# Patient Record
Sex: Male | Born: 1965 | Race: White | Hispanic: No | Marital: Single | Smoking: Never smoker
Health system: Southern US, Community
[De-identification: ages and names within clinical notes are randomized; demographics above are authoritative.]

## PROBLEM LIST (undated history)

## (undated) DIAGNOSIS — S24109A Unspecified injury at unspecified level of thoracic spinal cord, initial encounter: Secondary | ICD-10-CM

## (undated) DIAGNOSIS — I2510 Atherosclerotic heart disease of native coronary artery without angina pectoris: Secondary | ICD-10-CM

## (undated) DIAGNOSIS — E785 Hyperlipidemia, unspecified: Secondary | ICD-10-CM

## (undated) DIAGNOSIS — F1011 Alcohol abuse, in remission: Secondary | ICD-10-CM

## (undated) DIAGNOSIS — I10 Essential (primary) hypertension: Secondary | ICD-10-CM

## (undated) DIAGNOSIS — I480 Paroxysmal atrial fibrillation: Secondary | ICD-10-CM

## (undated) DIAGNOSIS — F1410 Cocaine abuse, uncomplicated: Secondary | ICD-10-CM

## (undated) DIAGNOSIS — E119 Type 2 diabetes mellitus without complications: Secondary | ICD-10-CM

## (undated) DIAGNOSIS — R7303 Prediabetes: Secondary | ICD-10-CM

## (undated) DIAGNOSIS — E669 Obesity, unspecified: Secondary | ICD-10-CM

## (undated) HISTORY — DX: Prediabetes: R73.03

## (undated) HISTORY — DX: Hyperlipidemia, unspecified: E78.5

## (undated) HISTORY — DX: Paroxysmal atrial fibrillation: I48.0

## (undated) HISTORY — DX: Cocaine abuse, uncomplicated: F14.10

## (undated) HISTORY — DX: Atherosclerotic heart disease of native coronary artery without angina pectoris: I25.10

## (undated) HISTORY — DX: Alcohol abuse, in remission: F10.11

## (undated) HISTORY — DX: Essential (primary) hypertension: I10

## (undated) HISTORY — DX: Obesity, unspecified: E66.9

---

## 2009-10-21 ENCOUNTER — Inpatient Hospital Stay: Payer: Self-pay | Admitting: Internal Medicine

## 2010-07-08 ENCOUNTER — Emergency Department: Payer: Self-pay | Admitting: Unknown Physician Specialty

## 2011-01-03 ENCOUNTER — Inpatient Hospital Stay: Payer: Self-pay | Admitting: Internal Medicine

## 2011-01-04 DIAGNOSIS — R072 Precordial pain: Secondary | ICD-10-CM

## 2011-01-21 ENCOUNTER — Inpatient Hospital Stay: Payer: Self-pay | Admitting: Internal Medicine

## 2011-01-21 DIAGNOSIS — R079 Chest pain, unspecified: Secondary | ICD-10-CM

## 2011-02-07 ENCOUNTER — Observation Stay: Payer: Self-pay | Admitting: Internal Medicine

## 2011-02-14 ENCOUNTER — Inpatient Hospital Stay: Payer: Self-pay | Admitting: Internal Medicine

## 2011-02-19 ENCOUNTER — Emergency Department: Payer: Self-pay | Admitting: Unknown Physician Specialty

## 2011-02-20 ENCOUNTER — Emergency Department: Payer: Self-pay | Admitting: Emergency Medicine

## 2011-02-28 ENCOUNTER — Emergency Department: Payer: Self-pay | Admitting: Emergency Medicine

## 2013-03-04 ENCOUNTER — Ambulatory Visit: Payer: Self-pay | Admitting: Family Medicine

## 2013-03-04 LAB — BODY FLUID CELL COUNT WITH DIFFERENTIAL
Basophil: 0 %
Eosinophil: 0 %
Neutrophils: 75 %
Other Cells BF: 0 %

## 2013-04-06 IMAGING — CR DG CHEST 1V PORT
1 series · 1 of 1 positions shown · non-contrast
Comparison: none

REASON FOR EXAM: Chest Pain
COMMENTS:

PROCEDURE:     DXR - DXR PORTABLE CHEST SINGLE VIEW  - January 20, 2011  [DATE]
RESULT:     Comparison: 01/03/2011

[view not recorded]
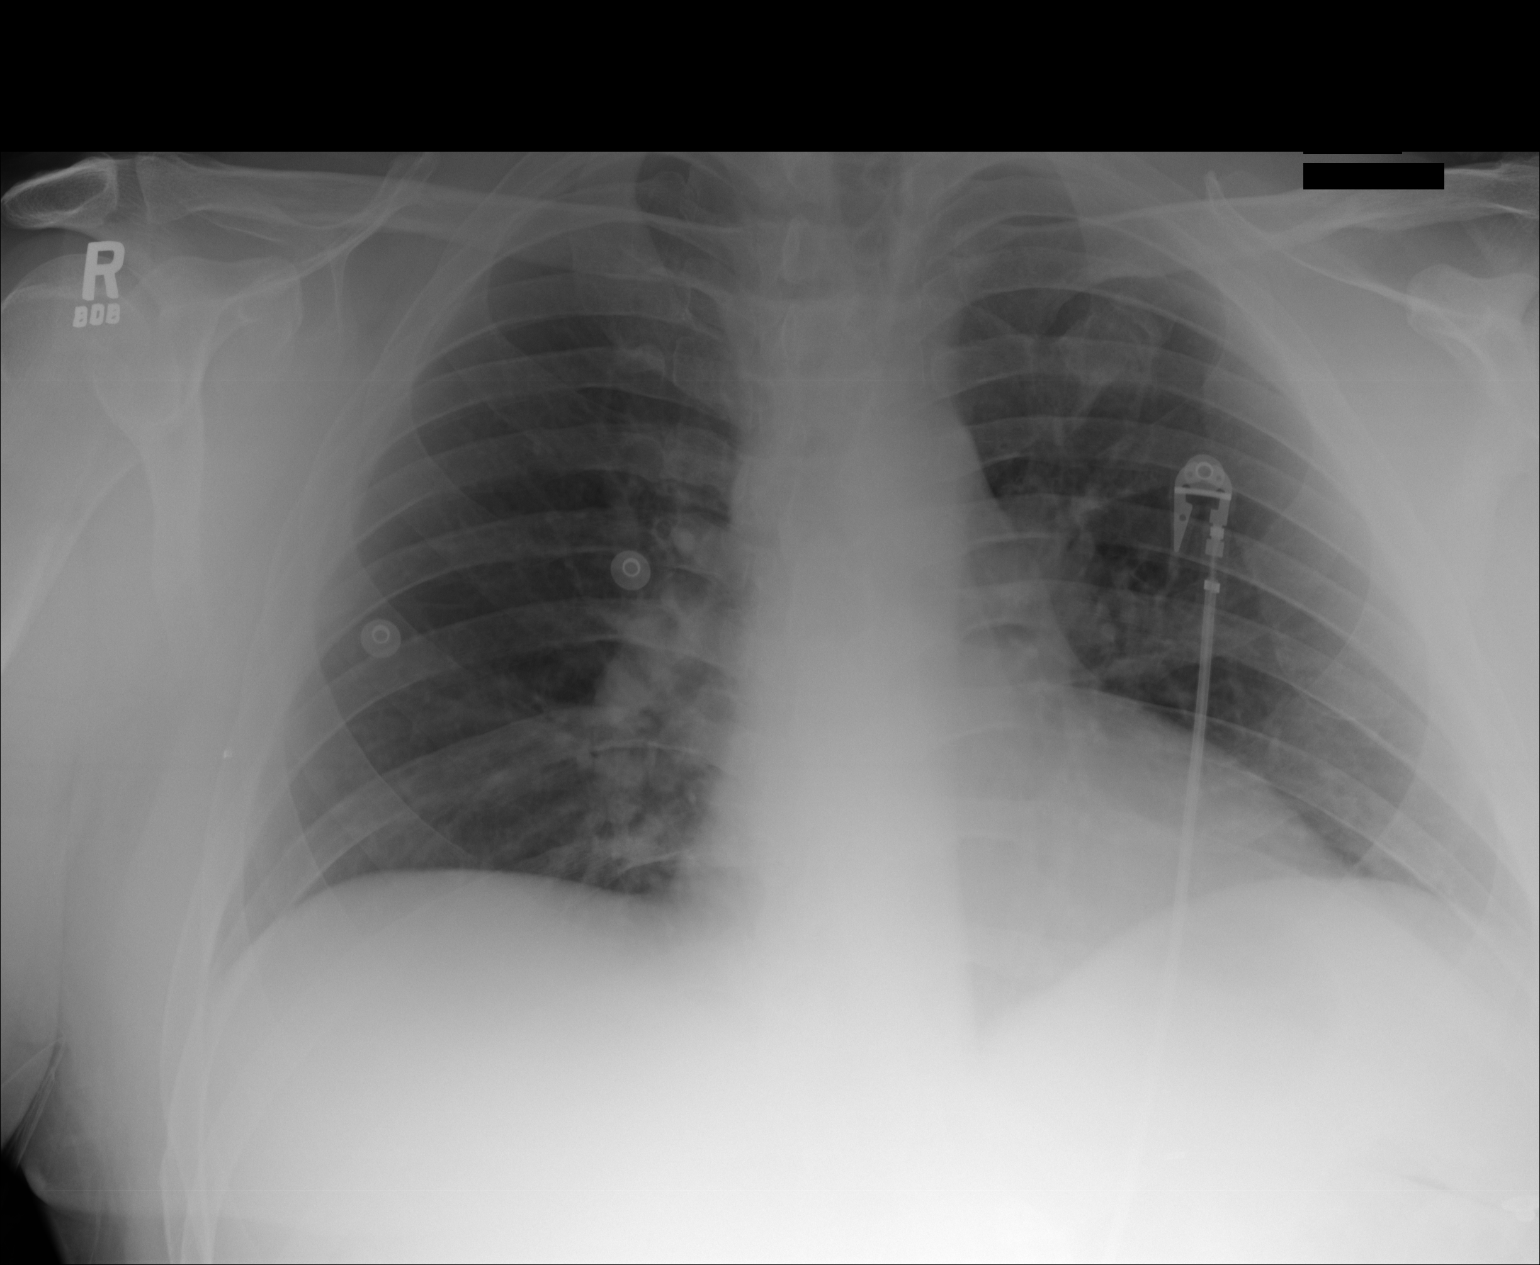

[1 of 1 positions shown; findings below may reference images not displayed]

FINDINGS: The lung volumes are low. Heart and mediastinum are stable. Prominence of
the central pulmonary vasculature is similar to prior. No focal pulmonary
opacities.
IMPRESSION: No acute cardiopulmonary disease.

## 2013-04-06 IMAGING — CT CT CHEST W/ CM
3 series · 17 of 31 positions shown, 19 images · non-contrast
Comparison: none

REASON FOR EXAM: chest pain
COMMENTS:

[Series 6: soft tissue · axial · 0.83mm/px · z∈[-668,-396]mm · 8 of 113 slices shown (1 of 2)]
[im 11/113  mediastinal]
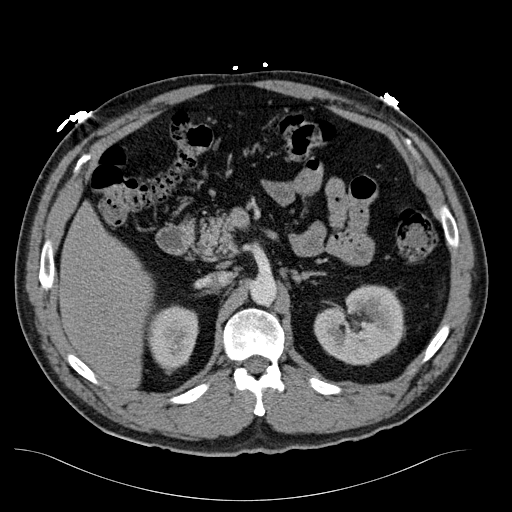
[im 31/113  mediastinal]
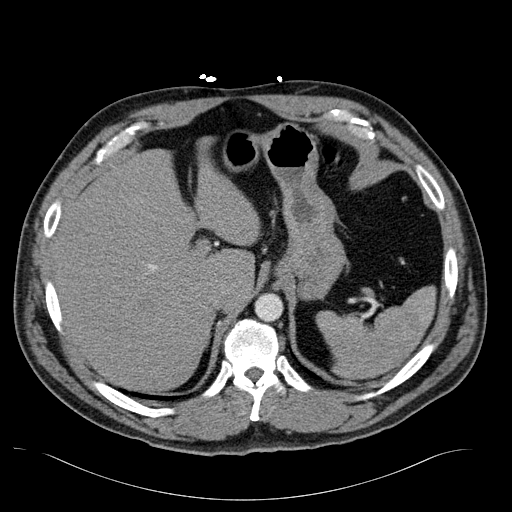
[im 41/113  mediastinal]
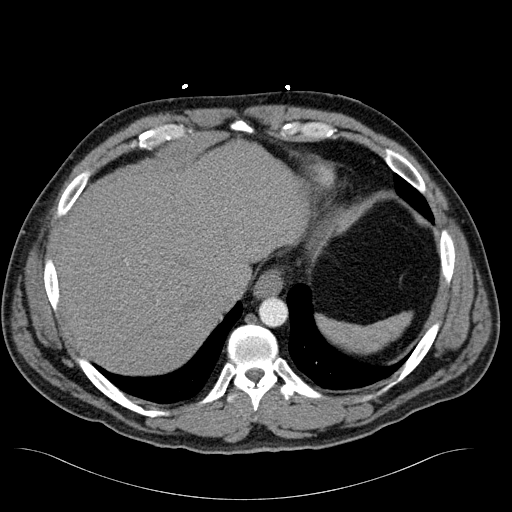
[im 51/113  mediastinal]
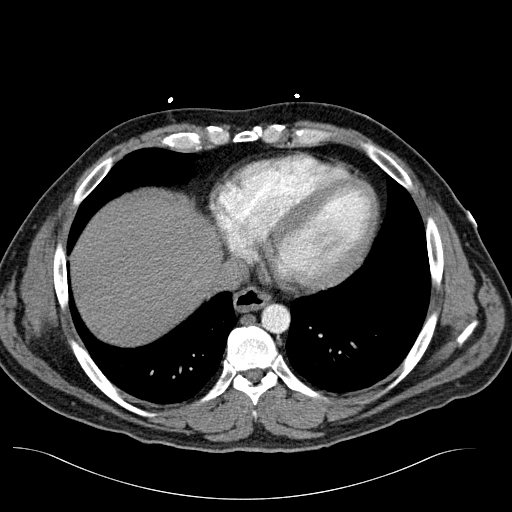
[im 62/113  mediastinal]
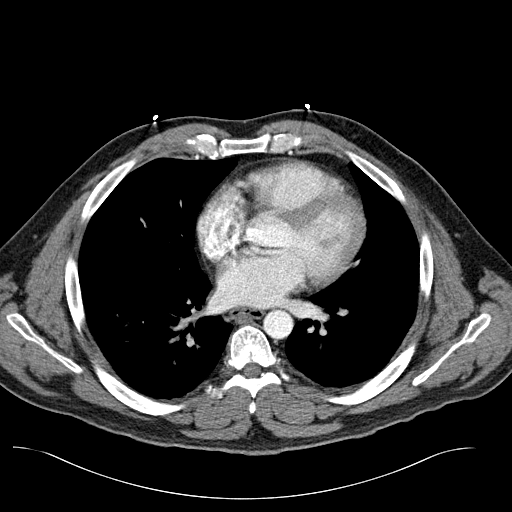
[im 72/113  mediastinal]
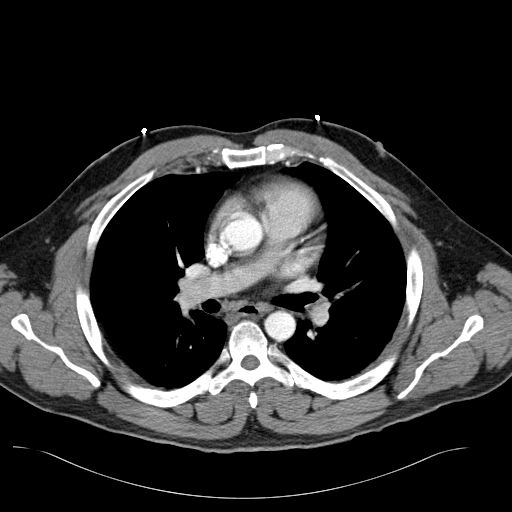
[im 82/113  mediastinal]
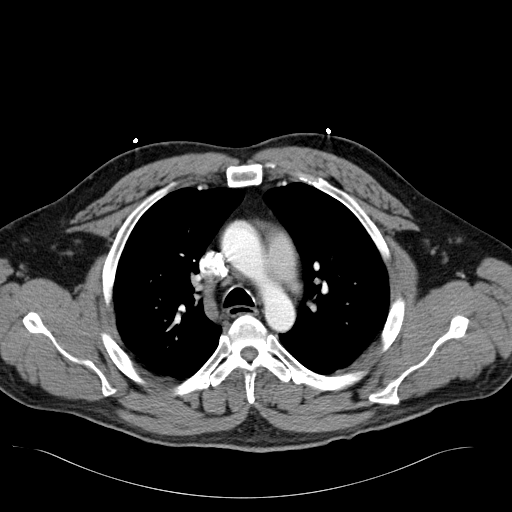
[im 102/113  mediastinal]
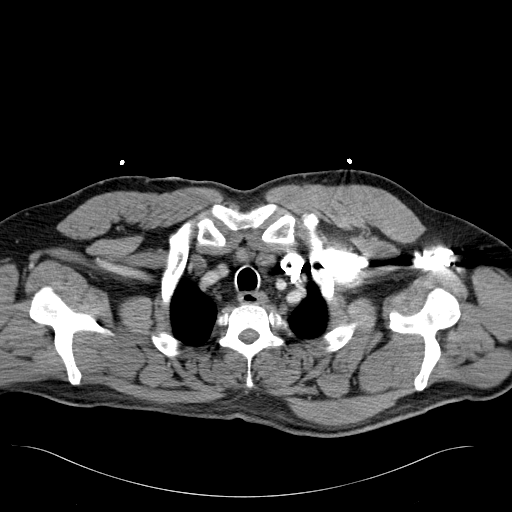

[Series 14: soft tissue · axial · 0.83mm/px · z∈[-620,-590]mm · 2 of 73 slices shown (2 of 2)]
[im 11/73  mediastinal]
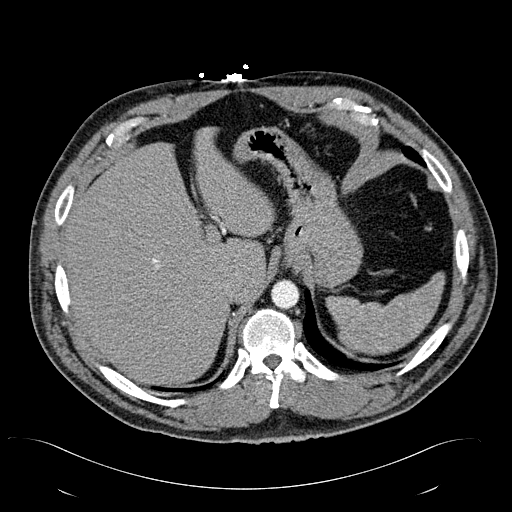
[im 21/73  mediastinal]
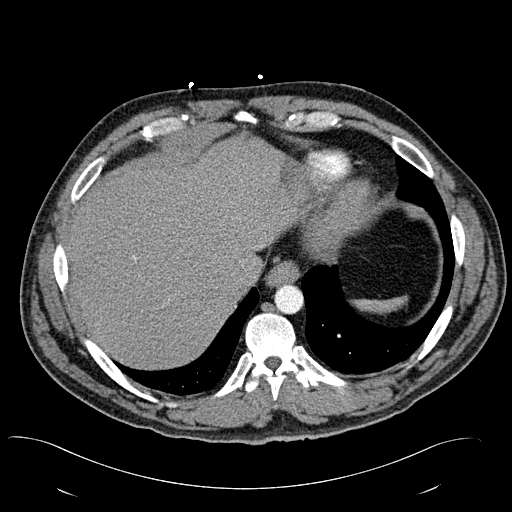

[Series 15: lung windows · axial · 0.83mm/px · z∈[-614,-464]mm · 7 of 71 slices shown, 9 images]
[im 11/71  mediastinal]
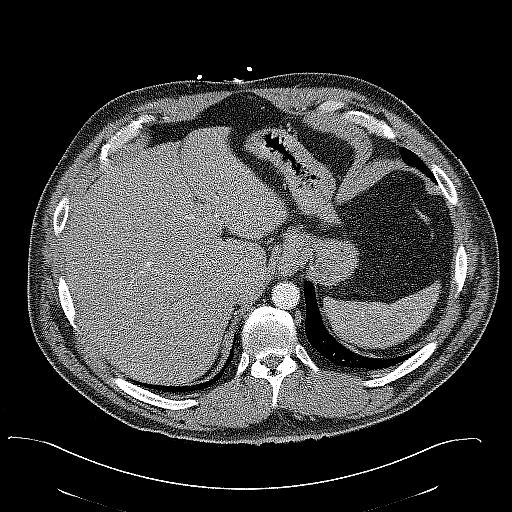
[im 11/71  lung]
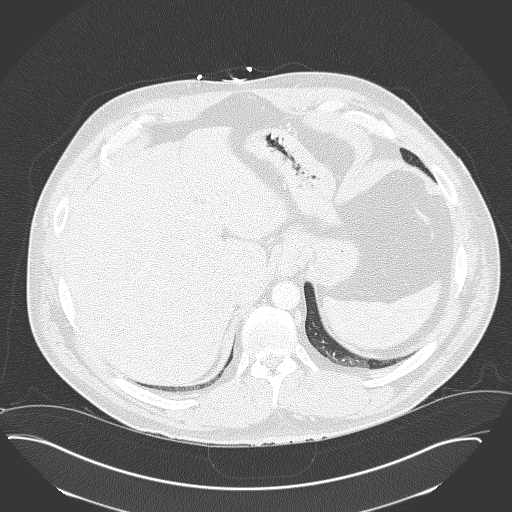
[im 21/71  lung]
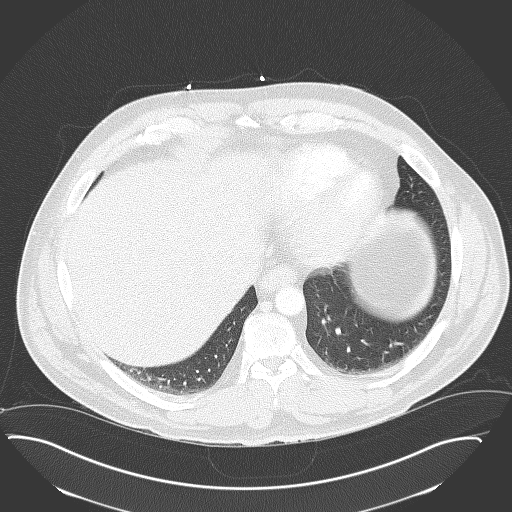
[im 31/71  lung]
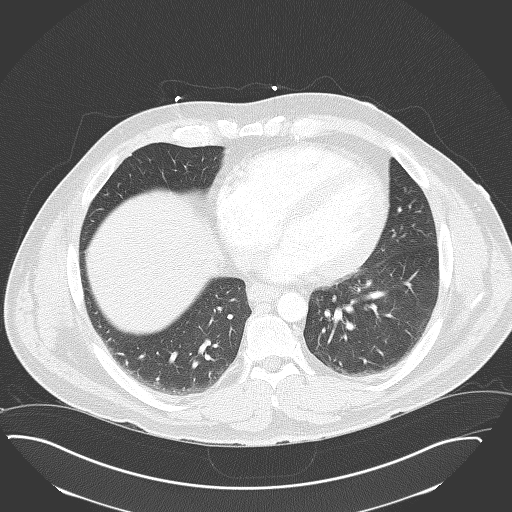
[im 36/71  lung]
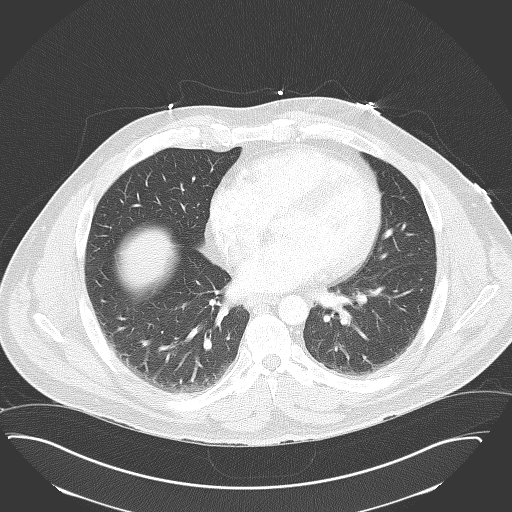
[im 41/71  mediastinal]
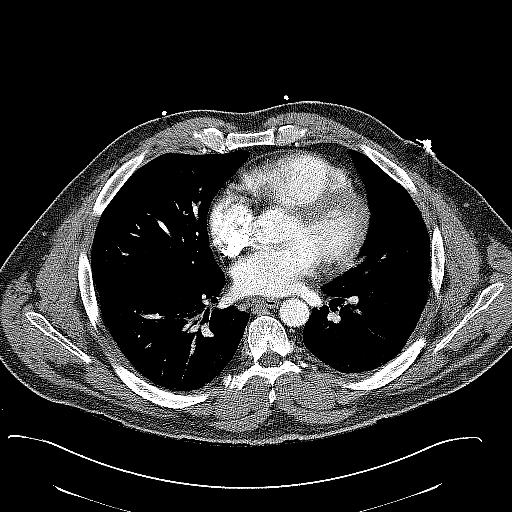
[im 41/71  lung]
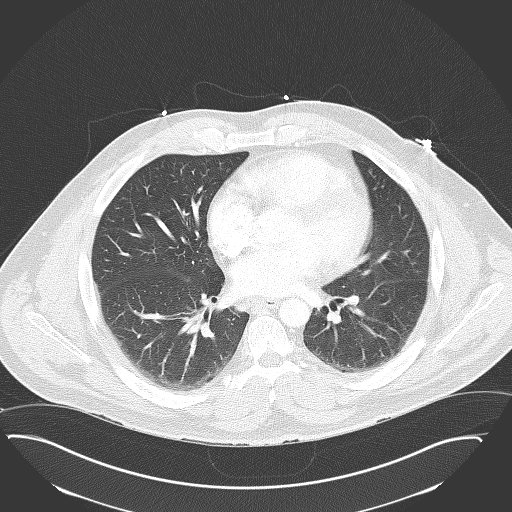
[im 51/71  lung]
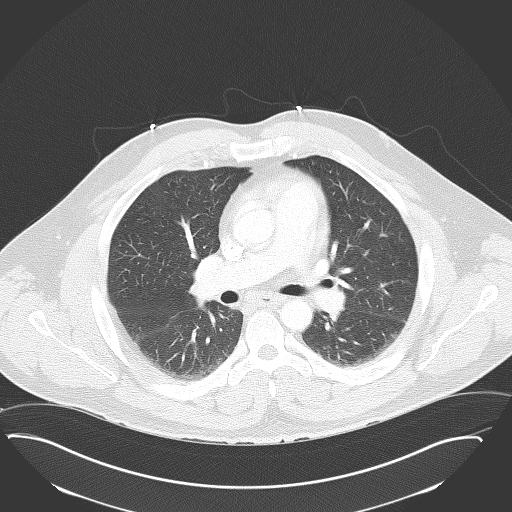
[im 61/71  lung]
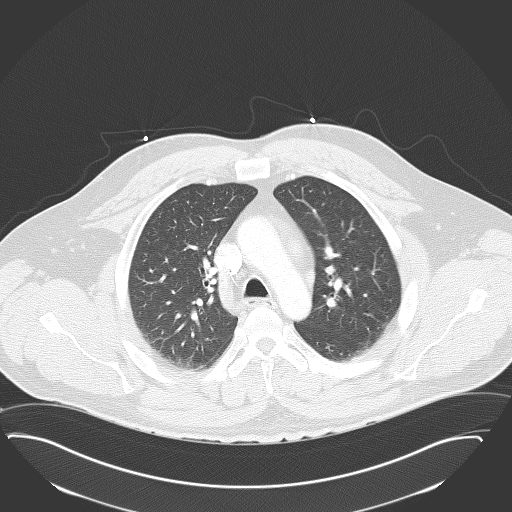

[17 of 31 positions shown; findings below may reference images not displayed]

PROCEDURE:     CT  - CT CHEST (FOR PE) W  - January 20, 2011 [DATE]

RESULT:     CT of the chest is performed with 100 mL of Hsovue-KFC iodinated
intravenous contrast with images reconstructed at 3.0 mm slice thickness in
the axial plane compared to the previous study of 01/04/2011 and to an
earlier exam of 10/22/2009.

The pulmonary arterial bolus is marginal in quality. No large central
embolic filling defects are present. The thoracic aorta appears normal in
caliber without evidence of dissection. No pleural or pericardial effusion
is demonstrated. A small sliding-type hiatal hernia is suggested. Small
accessory spleen is seen along the anterior-inferior aspect of the spleen on
image 87 and 88. The visualized pancreas, liver, gallbladder and kidneys
appear unremarkable. The included portions of the thyroid lobes appear
within normal limits. The heart is normal in size without or cardio
effusion. The lung window images demonstrate normal appearing aeration
without a focal mass or nodule demonstrated.
IMPRESSION: 1. No significant change compared to the recent exam. No thoracic aortic
aneurysm or dissection. The lungs appear clear. No pulmonary embolic filling
defect evident. The opacification of the pulmonary arterial system is
marginal at best.

## 2014-01-05 ENCOUNTER — Other Ambulatory Visit: Payer: Self-pay | Admitting: Nurse Practitioner

## 2014-01-05 ENCOUNTER — Observation Stay: Payer: Self-pay | Admitting: Specialist

## 2014-01-05 ENCOUNTER — Encounter: Payer: Self-pay | Admitting: Nurse Practitioner

## 2014-01-05 DIAGNOSIS — E785 Hyperlipidemia, unspecified: Secondary | ICD-10-CM

## 2014-01-05 DIAGNOSIS — I10 Essential (primary) hypertension: Secondary | ICD-10-CM

## 2014-01-05 DIAGNOSIS — I480 Paroxysmal atrial fibrillation: Secondary | ICD-10-CM

## 2014-01-05 DIAGNOSIS — R079 Chest pain, unspecified: Secondary | ICD-10-CM

## 2014-01-05 LAB — COMPREHENSIVE METABOLIC PANEL
AST: 18 U/L (ref 15–37)
Albumin: 3.5 g/dL (ref 3.4–5.0)
Alkaline Phosphatase: 57 U/L
Anion Gap: 14 (ref 7–16)
BUN: 6 mg/dL — ABNORMAL LOW (ref 7–18)
Bilirubin,Total: 0.7 mg/dL (ref 0.2–1.0)
CALCIUM: 8.7 mg/dL (ref 8.5–10.1)
CHLORIDE: 103 mmol/L (ref 98–107)
Co2: 19 mmol/L — ABNORMAL LOW (ref 21–32)
Creatinine: 0.97 mg/dL (ref 0.60–1.30)
EGFR (Non-African Amer.): >60
GLUCOSE: 201 mg/dL — AB (ref 65–99)
OSMOLALITY: 275 (ref 275–301)
Potassium: 3.5 mmol/L (ref 3.5–5.1)
SGPT (ALT): 25 U/L
Sodium: 136 mmol/L (ref 136–145)
TOTAL PROTEIN: 7.6 g/dL (ref 6.4–8.2)

## 2014-01-05 LAB — CBC WITH DIFFERENTIAL/PLATELET
BASOS ABS: 0.1 10*3/uL (ref 0.0–0.1)
Basophil %: 0.7 %
EOS ABS: 0.1 10*3/uL (ref 0.0–0.7)
Eosinophil %: 0.8 %
HCT: 45.1 % (ref 40.0–52.0)
HGB: 14.4 g/dL (ref 13.0–18.0)
LYMPHS ABS: 1.5 10*3/uL (ref 1.0–3.6)
Lymphocyte %: 16 %
MCH: 30 pg (ref 26.0–34.0)
MCHC: 32 g/dL (ref 32.0–36.0)
MCV: 94 fL (ref 80–100)
Monocyte #: 0.9 x10 3/mm (ref 0.2–1.0)
Monocyte %: 9.7 %
NEUTROS PCT: 72.8 %
Neutrophil #: 6.8 10*3/uL — ABNORMAL HIGH (ref 1.4–6.5)
PLATELETS: 257 10*3/uL (ref 150–440)
RBC: 4.81 10*6/uL (ref 4.40–5.90)
RDW: 13.5 % (ref 11.5–14.5)
WBC: 9.3 10*3/uL (ref 3.8–10.6)

## 2014-01-05 LAB — DRUG SCREEN, URINE
AMPHETAMINES, UR SCREEN: NEGATIVE (ref ?–1000)
Amphetamines, Ur Screen: NEGATIVE (ref ?–1000)
BARBITURATES, UR SCREEN: NEGATIVE (ref ?–200)
BARBITURATES, UR SCREEN: NEGATIVE (ref ?–200)
BENZODIAZEPINE, UR SCRN: NEGATIVE (ref ?–200)
Benzodiazepine, Ur Scrn: NEGATIVE (ref ?–200)
COCAINE METABOLITE, UR ~~LOC~~: POSITIVE (ref ?–300)
Cannabinoid 50 Ng, Ur ~~LOC~~: NEGATIVE (ref ?–50)
Cannabinoid 50 Ng, Ur ~~LOC~~: NEGATIVE (ref ?–50)
Cocaine Metabolite,Ur ~~LOC~~: POSITIVE (ref ?–300)
MDMA (ECSTASY) UR SCREEN: NEGATIVE (ref ?–500)
MDMA (Ecstasy)Ur Screen: NEGATIVE (ref ?–500)
Methadone, Ur Screen: NEGATIVE (ref ?–300)
Methadone, Ur Screen: NEGATIVE (ref ?–300)
OPIATE, UR SCREEN: POSITIVE (ref ?–300)
OPIATE, UR SCREEN: NEGATIVE (ref ?–300)
Phencyclidine (PCP) Ur S: NEGATIVE (ref ?–25)
Phencyclidine (PCP) Ur S: NEGATIVE (ref ?–25)
TRICYCLIC, UR SCREEN: NEGATIVE (ref ?–1000)
Tricyclic, Ur Screen: NEGATIVE (ref ?–1000)

## 2014-01-05 LAB — LIPID PANEL
CHOLESTEROL: 213 mg/dL — AB (ref 0–200)
HDL Cholesterol: 34 mg/dL — ABNORMAL LOW (ref 40–60)
LDL CHOLESTEROL, CALC: 156 mg/dL — AB (ref 0–100)
Triglycerides: 114 mg/dL (ref 0–200)
VLDL CHOLESTEROL, CALC: 23 mg/dL (ref 5–40)

## 2014-01-05 LAB — URINALYSIS, COMPLETE
BLOOD: NEGATIVE
Bacteria: NONE SEEN
Bilirubin,UR: NEGATIVE
Glucose,UR: >=500 mg/dL (ref 0–75)
LEUKOCYTE ESTERASE: NEGATIVE
Nitrite: NEGATIVE
PH: 5 (ref 4.5–8.0)
Protein: 30
RBC,UR: 8 /HPF (ref 0–5)
SPECIFIC GRAVITY: 1.028 (ref 1.003–1.030)
WBC UR: 8 /HPF (ref 0–5)

## 2014-01-05 LAB — PROTIME-INR
INR: 1.1
Prothrombin Time: 14 secs (ref 11.5–14.7)

## 2014-01-05 LAB — CK TOTAL AND CKMB (NOT AT ARMC)
CK, Total: 74 U/L
CK-MB: 0.6 ng/mL (ref 0.5–3.6)

## 2014-01-05 LAB — PLATELET COUNT: Platelet: 231 10*3/uL (ref 150–440)

## 2014-01-05 LAB — TROPONIN I
Troponin-I: <0.02 ng/mL
Troponin-I: <0.02 ng/mL
Troponin-I: <0.02 ng/mL

## 2014-01-06 LAB — LIPID PANEL
Cholesterol: 171 mg/dL (ref 0–200)
HDL Cholesterol: 30 mg/dL — ABNORMAL LOW (ref 40–60)
Ldl Cholesterol, Calc: 115 mg/dL — ABNORMAL HIGH (ref 0–100)
Triglycerides: 130 mg/dL (ref 0–200)
VLDL Cholesterol, Calc: 26 mg/dL (ref 5–40)

## 2014-01-06 LAB — HEMOGLOBIN A1C: Hemoglobin A1C: 11.2 % — ABNORMAL HIGH (ref 4.2–6.3)

## 2014-01-06 LAB — TSH: Thyroid Stimulating Horm: 0.711 u[IU]/mL

## 2014-01-07 LAB — SEDIMENTATION RATE: Erythrocyte Sed Rate: 55 mm/hr — ABNORMAL HIGH (ref 0–15)

## 2014-01-07 LAB — URIC ACID: URIC ACID: 4.3 mg/dL (ref 3.5–7.2)

## 2014-01-08 LAB — SYNOVIAL CELL COUNT + DIFF, W/ CRYSTALS
Basophil: 0 %
EOS PCT: 0 %
Lymphocytes: 3 %
Neutrophils: 87 %
Nucleated Cell Count: 2483 /mm3
Other Cells BF: 0 %
Other Mononuclear Cells: 10 %

## 2014-01-08 LAB — PLATELET COUNT: Platelet: 304 10*3/uL (ref 150–440)

## 2014-01-09 LAB — PLATELET COUNT: Platelet: 323 10*3/uL (ref 150–440)

## 2014-03-07 LAB — COMPREHENSIVE METABOLIC PANEL
Albumin: 3.6 g/dL (ref 3.4–5.0)
Alkaline Phosphatase: 63 U/L
Anion Gap: 15 (ref 7–16)
BILIRUBIN TOTAL: 0.3 mg/dL (ref 0.2–1.0)
BUN: 4 mg/dL — AB (ref 7–18)
CALCIUM: 8.4 mg/dL — AB (ref 8.5–10.1)
CHLORIDE: 106 mmol/L (ref 98–107)
CO2: 22 mmol/L (ref 21–32)
CREATININE: 0.97 mg/dL (ref 0.60–1.30)
EGFR (African American): >60
EGFR (Non-African Amer.): >60
Glucose: 143 mg/dL — ABNORMAL HIGH (ref 65–99)
OSMOLALITY: 284 (ref 275–301)
POTASSIUM: 3.3 mmol/L — AB (ref 3.5–5.1)
SGOT(AST): 37 U/L (ref 15–37)
SGPT (ALT): 53 U/L
SODIUM: 143 mmol/L (ref 136–145)
TOTAL PROTEIN: 7.6 g/dL (ref 6.4–8.2)

## 2014-03-07 LAB — CBC
HCT: 50.4 % (ref 40.0–52.0)
HGB: 16.7 g/dL (ref 13.0–18.0)
MCH: 30.1 pg (ref 26.0–34.0)
MCHC: 33 g/dL (ref 32.0–36.0)
MCV: 91 fL (ref 80–100)
PLATELETS: 276 10*3/uL (ref 150–440)
RBC: 5.54 10*6/uL (ref 4.40–5.90)
RDW: 16.2 % — AB (ref 11.5–14.5)
WBC: 4.3 10*3/uL (ref 3.8–10.6)

## 2014-03-07 LAB — TROPONIN I

## 2014-03-07 LAB — ETHANOL: ETHANOL LVL: 145 mg/dL

## 2014-03-08 ENCOUNTER — Observation Stay: Payer: Self-pay | Admitting: Specialist

## 2014-03-08 LAB — CK-MB
CK-MB: 1.1 ng/mL (ref 0.5–3.6)
CK-MB: 1.2 ng/mL (ref 0.5–3.6)
CK-MB: 0.9 ng/mL (ref 0.5–3.6)

## 2014-03-08 LAB — TROPONIN I
Troponin-I: <0.02 ng/mL
Troponin-I: <0.02 ng/mL

## 2014-03-23 ENCOUNTER — Inpatient Hospital Stay: Payer: Self-pay | Admitting: Psychiatry

## 2014-03-23 LAB — URINALYSIS, COMPLETE
BACTERIA: NONE SEEN
Bilirubin,UR: NEGATIVE
Blood: NEGATIVE
Glucose,UR: 150 mg/dL (ref 0–75)
LEUKOCYTE ESTERASE: NEGATIVE
Nitrite: NEGATIVE
PH: 6 (ref 4.5–8.0)
Protein: 100
Specific Gravity: 1.028 (ref 1.003–1.030)
Squamous Epithelial: 4

## 2014-03-23 LAB — SALICYLATE LEVEL

## 2014-03-23 LAB — CBC
HCT: 49 % (ref 40.0–52.0)
HGB: 16.2 g/dL (ref 13.0–18.0)
MCH: 30.5 pg (ref 26.0–34.0)
MCHC: 33.1 g/dL (ref 32.0–36.0)
MCV: 92 fL (ref 80–100)
Platelet: 250 10*3/uL (ref 150–440)
RBC: 5.32 10*6/uL (ref 4.40–5.90)
RDW: 16.1 % — AB (ref 11.5–14.5)
WBC: 4.1 10*3/uL (ref 3.8–10.6)

## 2014-03-23 LAB — COMPREHENSIVE METABOLIC PANEL
ALK PHOS: 62 U/L
ALT: 49 U/L
ANION GAP: 12 (ref 7–16)
AST: 31 U/L (ref 15–37)
Albumin: 3.6 g/dL (ref 3.4–5.0)
BUN: 10 mg/dL (ref 7–18)
Bilirubin,Total: 0.2 mg/dL (ref 0.2–1.0)
CO2: 21 mmol/L (ref 21–32)
CREATININE: 1.09 mg/dL (ref 0.60–1.30)
Calcium, Total: 8.2 mg/dL — ABNORMAL LOW (ref 8.5–10.1)
Chloride: 107 mmol/L (ref 98–107)
EGFR (Non-African Amer.): >60
Glucose: 209 mg/dL — ABNORMAL HIGH (ref 65–99)
OSMOLALITY: 285 (ref 275–301)
Potassium: 4.1 mmol/L (ref 3.5–5.1)
Sodium: 140 mmol/L (ref 136–145)
TOTAL PROTEIN: 7.6 g/dL (ref 6.4–8.2)

## 2014-03-23 LAB — DRUG SCREEN, URINE
Amphetamines, Ur Screen: NEGATIVE (ref ?–1000)
Barbiturates, Ur Screen: NEGATIVE (ref ?–200)
Benzodiazepine, Ur Scrn: NEGATIVE (ref ?–200)
Cannabinoid 50 Ng, Ur ~~LOC~~: NEGATIVE (ref ?–50)
Cocaine Metabolite,Ur ~~LOC~~: POSITIVE (ref ?–300)
MDMA (Ecstasy)Ur Screen: NEGATIVE (ref ?–500)
Methadone, Ur Screen: NEGATIVE (ref ?–300)
Opiate, Ur Screen: POSITIVE (ref ?–300)
Phencyclidine (PCP) Ur S: NEGATIVE (ref ?–25)
TRICYCLIC, UR SCREEN: NEGATIVE (ref ?–1000)

## 2014-03-23 LAB — ACETAMINOPHEN LEVEL: Acetaminophen: <2 ug/mL

## 2014-03-23 LAB — ETHANOL: Ethanol: 75 mg/dL

## 2014-06-28 LAB — BASIC METABOLIC PANEL
Anion Gap: 13 (ref 7–16)
BUN: 8 mg/dL
CALCIUM: 8.9 mg/dL
CO2: 25 mmol/L
Chloride: 101 mmol/L
Creatinine: 0.94 mg/dL
EGFR (African American): >60
Glucose: 171 mg/dL — ABNORMAL HIGH
POTASSIUM: 3.3 mmol/L — AB
Sodium: 139 mmol/L

## 2014-06-28 LAB — CBC
HCT: 42.4 % (ref 40.0–52.0)
HGB: 14 g/dL (ref 13.0–18.0)
MCH: 30.3 pg (ref 26.0–34.0)
MCHC: 33.1 g/dL (ref 32.0–36.0)
MCV: 92 fL (ref 80–100)
Platelet: 209 10*3/uL (ref 150–440)
RBC: 4.62 10*6/uL (ref 4.40–5.90)
RDW: 16.3 % — ABNORMAL HIGH (ref 11.5–14.5)
WBC: 8.8 10*3/uL (ref 3.8–10.6)

## 2014-06-28 LAB — TROPONIN I
Troponin-I: <0.03 ng/mL
Troponin-I: <0.03 ng/mL

## 2014-06-28 LAB — D-DIMER(ARMC): D-Dimer: 235 ng/ml

## 2014-06-28 LAB — PRO B NATRIURETIC PEPTIDE: B-Type Natriuretic Peptide: 21 pg/mL

## 2014-06-29 ENCOUNTER — Observation Stay
Admit: 2014-06-29 | Disposition: A | Discharge status: Home / Self Care | Payer: Self-pay | Attending: Internal Medicine | Admitting: Internal Medicine

## 2014-06-29 LAB — TROPONIN I

## 2014-06-29 LAB — DRUG SCREEN, URINE
Amphetamines, Ur Screen: POSITIVE
Barbiturates, Ur Screen: NEGATIVE
Benzodiazepine, Ur Scrn: POSITIVE
CANNABINOID 50 NG, UR ~~LOC~~: NEGATIVE
Cocaine Metabolite,Ur ~~LOC~~: NEGATIVE
MDMA (Ecstasy)Ur Screen: NEGATIVE
METHADONE, UR SCREEN: NEGATIVE
Opiate, Ur Screen: POSITIVE
PHENCYCLIDINE (PCP) UR S: NEGATIVE
Tricyclic, Ur Screen: NEGATIVE

## 2014-07-11 ENCOUNTER — Institutional Professional Consult (permissible substitution): Payer: Self-pay | Admitting: Internal Medicine

## 2014-07-12 ENCOUNTER — Encounter: Payer: Self-pay | Admitting: *Deleted

## 2014-07-21 NOTE — Discharge Summary (Signed)
PATIENT NAMCain Saupe:  [name], [name] MR#:  [id number] DATE OF BIRTH:  [birth year]  DATE OF ADMISSION:  03/08/2014 DATE OF DISCHARGE:  03/08/2014  For a detailed note, please check the history and physical done on admission by Dr. Angelica Ranavid Hower.   DIAGNOSES AT DISCHARGE: As follows: 1.  Chest pain, likely cocaine-induced demand ischemia and also musculoskeletal in nature.  2.  Hypertension.  3.  Hyperlipidemia.  4.  Diabetes.  5.  History of gout.  6.  Substance abuse.   DIET: The patient is being discharged on a low-sodium, carbohydrate-controlled diet.   ACTIVITY: As tolerated.  FOLLOWUP: Follow up at the [facility] in the next 1 to 2 weeks.   DISCHARGE MEDICATIONS: As follows: Lisinopril 5 mg daily, glipizide 5 mg b.i.d., gabapentin 100 mg t.i.d., metformin 1000 mg b.i.d., aspirin 81 mg daily, Cardizem CD 120 mg daily, lovastatin 20 mg daily, allopurinol 100 mg daily, colchicine 0.6 mg 1 capsule b.i.d., Percocet 5/325, 1 tablet q. 6 hours as needed for pain.   PERTINENT STUDIES DONE DURING THE HOSPITAL COURSE: CT scan of the head done without contrast on admission showing no acute intracranial abnormalities. A chest x-ray done on admission showing shallow inspiration without evidence of acute cardiopulmonary disease.   HOSPITAL COURSE: This is a 49 year old male who presented to the hospital with chest pain.  1.  Chest pain. The most likely cause of the patient's chest pain was cocaine-induced demand ischemia also complicated with underlying musculoskeletal in nature. The patient was recently hospitalized last month for similar symptoms, underwent observation and had a myocardial scan done which showed no evidence of cardiac ischemia. The patient was observed on telemetry while in the hospital, had 3 sets of cardiac markers checked, which were negative. He still continues to have chest pain which I think is more musculoskeletal in nature. Since his cardiac markers are negative, he is being  discharged home and strongly advised to quit abusing cocaine.  2.  Diabetes. The patient's blood sugars remained stable. He will continue his glipizide, metformin.  3.  Hypertension. The patient remained hemodynamically stable. He will continue his lisinopril, Cardizem.  4.  History of gout. There was no acute gout attack. He will continue colchicine, allopurinol.  5.  Hyperlipidemia. The patient was maintained on his lovastatin. He will resume that.   CODE STATUS: The patient is a full code.   TIME SPENT: 35 minutes.    ____________________________ Rolly PancakeVivek J. Cherlynn KaiserSainani, MD vjs:ST D: 03/08/2014 15:48:08 ET T: 03/09/2014 02:49:54 ET JOB#: [id number]  cc: Rolly PancakeVivek J. Cherlynn KaiserSainani, MD, <Dictator> Houston SirenVIVEK J Trenee Igoe MD ELECTRONICALLY SIGNED 03/16/2014 10:45

## 2014-07-21 NOTE — H&P (Signed)
PATIENT NAMETERRIUS, [name] MR#:  [id number] DATE OF BIRTH:  1965/09/14  DATE OF ADMISSION:  03/08/2014  REFERRING PHYSICIAN: Dr. Manson Passey   PRIMARY CARE PHYSICIAN: None.   CARDIOLOGIST: None.  CHIEF COMPLAINT: Chest pain.   HISTORY OF PRESENT ILLNESS: This is a 49 year old Caucasian male with a history of coronary artery disease; essential hypertension; type 2 diabetes, non-insulin-requiring, uncomplicated presenting with chest pain. Describes retrosternal chest pain which occurred at rest and after cocaine usage, radiation to the left shoulder, head and neck area, sharp in quality which changed to pressure, intensity 10/10. No relieving or worsening factors. Associated with nausea with shortness of breath and, once again, admits to cocaine abuse.   REVIEW OF SYSTEMS:  CONSTITUTIONAL: Denies fevers, chills, fatigue, weakness.  EYES: Denies blurred vision, double vision, or eye pain.  EARS, NOSE, AND THROAT: Denies tinnitus, ear pain, hearing loss.  RESPIRATORY: Denies cough or shortness of shortness of breath. Denies wheezing.  CARDIOVASCULAR: Positive for chest pain as described above. Denies any palpitations or edema.  GASTROINTESTINAL: Denies nausea, vomiting, diarrhea, or abdominal pain.  GENITOURINARY: Denies dysuria or hematuria.  ENDOCRINE: Denies nocturia or thyroid problems. HEMATOLOGIC AND LYMPHATIC: Denies easy bruising or bleeding.  SKIN: Denies  rashes or lesions.  MUSCULOSKELETAL: Denies pain in neck, back, shoulder, knees, hips, or arthritic symptoms.  NEUROLOGIC: Denies paralysis or paresthesias.  PSYCHIATRIC: Denies anxiety or depressive symptoms. Otherwise, a full review of systems performed by me is negative.   PAST MEDICAL HISTORY: Coronary artery disease without stent placement; hypertension essential; type 2 diabetes, non-insulin-requiring, uncomplicated; as well as gout.   SOCIAL HISTORY: Positive for occasional alcohol use. Denies any tobacco use. Positive for  cocaine usage as well.   FAMILY HISTORY: Positive for coronary artery disease late onset in his grandfather and COPD in his father.   ALLERGIES: No known drug allergies.   HOME MEDICATIONS: Include aspirin 81 mg p.o. daily, Percocet 5/325 mg p.o. q.6 hours as needed for pain, lisinopril 5 mg p.o. daily, diltiazem 120 mg p.o. daily, gabapentin 100 mg p.o. 3 times daily, glipizide 5 mg p.o. b.i.d., metformin 1000 mg p.o. b.i.d., allopurinol 100 mg p.o. at bedtime, colchicine 0.6 mg p.o. b.i.d., lovastatin 20 mg p.o. daily.   PHYSICAL EXAMINATION:  VITAL SIGNS: Temperature 99, heart rate 66, respirations 22, blood pressure 130/86, saturating 98% on room air. Weight 102.1 kg, BMI of 30.5.  GENERAL: Well-nourished, well-developed, Caucasian male currently in no acute distress.  HEAD: Normocephalic, atraumatic.  EYES: Pupils equal, round reactive to light. Extraocular muscles intact. No scleral icterus.  MOUTH: Moist mucosal membrane. Dentition intact. No abscess noted. EAR, NOSE, THROAT: Clear without exudates. No external lesions.  NECK: Supple. No thyromegaly. No nodules. No JVD.  PULMONARY: Clear to auscultation bilaterally without wheezes, rales, or rhonchi. No use of accessory muscles. Good respiratory effort.  CHEST: Nontender to palpation.  CARDIOVASCULAR: S1, S2, regular rate and rhythm. No murmurs, rubs, or gallops. No edema. Pedal pulses 2+ bilaterally.  GASTROINTESTINAL: Soft, nontender, nondistended. No masses. Positive bowel sounds. No hepatosplenomegaly.  MUSCULOSKELETAL: No swelling, clubbing, or edema. Range of motion full in all extremities.  NEUROLOGIC: Cranial nerves II-XII intact. No gross focal neurological deficits. Sensation intact. Reflexes intact.  SKIN: No ulceration, lesions, rashes, or cyanosis. Skin warm, dry. Turgor intact.  PSYCHIATRIC: Mood and affect within normal limits. Patient awake, alert, oriented x 3. Insight and judgment intact.   LABORATORY DATA: EKG  performed which reveals a lateral T wave inversion which appear to  be new from prior EKGs. The remainder of laboratory data: Sodium 143, potassium 3.3, chloride 106, bicarbonate 22, BUN 4, creatinine 0.97, glucose 143, ethanol of 145. LFTs within normal limits. WBC of 4.3, hemoglobin 16.7, platelets of 276,000.   ASSESSMENT AND PLAN: A 49 year old Caucasian gentleman with history of coronary artery disease; essential hypertension; type 2 diabetes non-insulin-requiring, uncomplicated; as well as cocaine abuse, presenting with chest pain. Of note, recently admitted back in October of this year for similar presentation with chest pain after cocaine usage had a stress at that time which were performed within normal limits.  1.  Chest pain central in location: Initiate aspirin and statin therapy. Nitroglycerin as well as morphine p.r.n.Marland Kitchen. Place on telemetry. Trend cardiac enzymes x 3, if elevated we will start with heparin drip. Avoid beta blockers given cocaine and also check urine drug screen to confirm his cocaine use, though I have no doubt to question him.  2.  Type 2 diabetes non-insulin-requiring, uncomplicated: Hold p.o. agents. Add insulin sliding scale, q.6 hour Accu-Cheks.  3.  Hyperlipidemia, unspecified: Continue with statin therapy.  4.  Hypertension, essential: Continue with lisinopril, Cardizem.  5.  Venous thromboembolism prophylaxis: Heparin subcutaneous.  CODE STATUS: Patient is full code.  TIME SPENT: 45 minutes.    ____________________________ Justin Athensavid K. Douglas Rooks, MD dkh:bm D: 03/08/2014 01:00:26 ET T: 03/08/2014 01:17:32 ET JOB#: [id number]  cc: Justin Athensavid K. Justin River, MD, <Dictator> [name] [name] Justin Mangione MD ELECTRONICALLY SIGNED 03/13/2014 20:37

## 2014-07-21 NOTE — H&P (Signed)
PATIENT NAMESIAH, Justin [name] MR#:  [id number] DATE OF BIRTH:  14-May-1965  DATE OF ADMISSION:  01/05/2014  REFERRING PHYSICIAN:  Enedina [name]. Justin [name], [name]   PRIMARY CARE PHYSICIAN:  Justin [name].   ADMISSION DIAGNOSIS:  Chest pain.   HISTORY OF PRESENT ILLNESS:  This is a 49 year old Caucasian male who presents to the Emergency Department with chest pain. The pain began after the patient admits to using cocaine. He has been smoking crack cocaine as well as using freebase for the last 18 days in a row. He states he has not eaten much and has slept on rare occasion. The pain began over his left breast, then radiated like an ache to his left shoulder. The pain began as 8/10 in severity and remains the same after Ativan. The patient has a history of myocardial infarction. The Emergency Department called for admission due to history of coronary artery disease and chronic chest pain.   REVIEW OF SYSTEMS:  CONSTITUTIONAL:  The patient denies fever or weakness.  EYES:  Denies blurred vision or inflammation.  EARS, NOSE AND THROAT:  Denies tinnitus or sore throat.  RESPIRATORY:  Denies cough or shortness of breath.  CARDIOVASCULAR:  Admits to chest pain but denies palpitations or dyspnea on exertion.  GASTROINTESTINAL:  Denies nausea, vomiting, diarrhea, or abdominal pain.  GENITOURINARY:  Denies dysuria, increased frequency, or hesitancy.  ENDOCRINE:  The patient denies polyuria or nocturia.  HEMATOLOGIC AND LYMPHATIC:  Denies easy bruising or bleeding.  INTEGUMENT:  Denies rashes or lesions.  MUSCULOSKELETAL:  The patient admits to left elbow pain that began after a fall while he was dizzy. Denies myalgias.  NEUROLOGIC:  The patient denies numbness in his extremities but admits to dizziness at the onset of his chest pain; this has now resolved.  PSYCHIATRIC:  Admits to some depression but denies suicidal ideation.   PAST MEDICAL HISTORY:  Coronary artery disease and myocardial infarction, hypertension,  borderline diabetes, and gout.   PAST SURGICAL HISTORY:  Umbilical hernia repair.   SOCIAL HISTORY:  Occasional alcohol use, although the patient is a former alcoholic. He denies tobacco abuse but admits to chronic cocaine abuse. He currently lives with his mother, and he is employed as a Network engineer.   FAMILY HISTORY:  His mother is a lung cancer survivor. She is a smoker.   MEDICATIONS:  None.   ALLERGIES:  No known drug allergies.   PERTINENT LABORATORY RESULTS AND RADIOGRAPHIC FINDINGS:  BUN is 6, creatinine 0.97, sodium 136, potassium 3.5, chloride 103, bicarbonate 19, calcium 8.7, serum albumin 3.5, alkaline phosphatase 57, AST 18, ALT 25. Troponin is negative. Chest x-ray shows no acute cardiopulmonary disease.   PHYSICAL EXAMINATION: VITAL SIGNS:  Temperature is 99, pulse 86, respirations 22, blood pressure 133/93, and pulse oximetry 99% on 1 liter of oxygen via nasal cannula.  GENERAL:  The patient is alert and oriented. He is clearly uncomfortable but not in any respiratory distress.  HEENT:  Normocephalic, atraumatic. Pupils are equal, round, and reactive to light and accommodation. Extraocular movements are intact. Mucous membranes are mildly dry.  NECK:  Trachea is midline. No adenopathy.  CHEST:  Symmetric and atraumatic.  CARDIOVASCULAR:  Regular rate and rhythm. Normal S1, S2. No rubs, clicks, or murmurs appreciated.  LUNGS:  Clear to auscultation bilaterally. Normal effort and excursion.  ABDOMEN:  Positive bowel sounds. Soft, nontender, nondistended. No hepatosplenomegaly.  GENITOURINARY:  Deferred.  MUSCULOSKELETAL:  The patient moves all 4 extremities equally. He has 5/5  strength in upper and lower extremities bilaterally. There is an effusion of the left elbow and decreased range of motion on extension of his left arm.  SKIN:  No rashes or lesions.  EXTREMITIES:  No clubbing, cyanosis, or edema.  NEUROLOGIC:  Cranial nerves II through XII  are grossly intact.  PSYCHIATRIC:  Mood is normal. Affect is congruent.   ASSESSMENT AND PLAN:  This is a 49 year old male admitted for chest pain due to demand ischemia secondary to cocaine abuse.   1.  Chest pain. The patient obviously has some ischemia due to increased demand from cocaine abuse. His cardiac enzymes are negative so far, and he has no acute EKG changes. He has a history of coronary artery disease, and thus we will rule out myocardial infarction.  2.  Elbow pain. There is an effusion of the left elbow on physical examination. The patient has a hard time extending his lower arm, thus we will obtain plain films to evaluate for fracture of the elbow.  3.  Deep vein thrombosis prophylaxis:  Heparin.  4.  Gastrointestinal prophylaxis:  Pantoprazole.  5.  The patient is interested in drug rehabilitation, and thus I have ordered a discharge planning consult.   CODE STATUS:  The patient is a full code.   TIME SPENT ON ADMISSION ORDERS AND PATIENT CARE:  Approximately 35 minutes.   ____________________________ Justin Justin [name]:nb D: 01/05/2014 02:07:56 ET T: 01/05/2014 02:31:58 ET JOB#: [id number]  cc: Justin [name], <Dictator> Justin [name] ELECTRONICALLY SIGNED 01/07/2014 0:03

## 2014-07-21 NOTE — Consult Note (Signed)
PATIENT NAMStandley Justin:  Justin, [name] MR#:  [id number] DATE OF BIRTH:  1965-05-01  DATE OF CONSULTATION:  01/08/2014  REFERRING PHYSICIAN:  Dr.   Allena KatzPatel. CONSULTING PHYSICIAN:  Dessie ComaGeorge Wallace Kernodle Jr., MD  REASON FOR CONSULTATION:  Joint pain.  HISTORY OF PRESENT ILLNESS: A 49 year old white male. Currently out of work for over 6 months because via [employer] action and lawsuit. Has had a history of polyarticular gout. We aspirated him in 2012 during hospitalization. Uric acid was only in the 4 range then, but aspiration showed crystals. He says he has not really had an attack in a couple years. He has had some recent cocaine use. He was admitted with chest pain. His left elbow started swelling and he had a hard time extending it. Then had swelling in his left knee and then both ankles. He has had difficulty walking. Has new diagnosis of diabetes. Hemoglobin A1c is 11. He has been on oral steroid. Sugars have been 200-300. Still having some pain in the left knee, better.   PAST MEDICAL HISTORY: 1.  Alcohol and substance abuse.  2.  Diabetes.  3.  Gouty arthritis.  4.  Coronary disease.   SOCIAL HISTORY: Currently not working.   FAMILY HISTORY: Negative for gout.   REVIEW OF SYSTEMS: Recent chest pain. No shortness of breath or dyspnea.   PHYSICAL EXAMINATION:  VITAL SIGNS:  Temperature 97, pulse 66, blood pressure 122/81,  O2 saturation 92. MUSCULOSKELETAL:  Exam performed. Good range of motion of his neck and shoulder. He has left elbow synovitis. He cannot completely extend right elbow without synovitis. Olecranon bursa is nontender. Hands without synovitis or dactylitis. Left knee has effusion. Pain with flexion. There is bilateral ankle synovitis.   PROCEDURE: Left knee was prepped in a sterile manner. I aspirated 20 mL of mildly inflammatory fluid and injected with 2 mL of Xylocaine, 2 mL Marcaine and 1 mL Kenalog.   IMPRESSION:  1.  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  2.  Diabetes, making steroid use more difficult.  3.  Drug use and drug abuse.   PLAN:  1.  Consider nonsteroidals since his creatinine is normal. We will add Colcrys 0.6 mg 1 p.o.  twice a day. Consider not using oral steroid if he can get by with anti-inflammatory drugs.  2.  Given 2 polyarticular major flares, it would be best to keep him on uric acid lowering agents. After he stops having a flare he can start allopurinol 100 mg with followup with his primary physician with a creatinine and uric acid.   ____________________________ Dessie ComaGeorge Wallace Kernodle Jr., MD gwk:LT D: 01/08/2014 17:35:00 ET T: 01/08/2014 18:00:19 ET JOB#: [id number]  cc: Dessie ComaGeorge Wallace Kernodle Jr., MD, <Dictator> Webb SilversmithGEORGE W KERNODLE J MD ELECTRONICALLY SIGNED 01/09/2014 13:58

## 2014-07-21 NOTE — Consult Note (Signed)
Brief Consult Note: Patient was seen by consultant.   Consult note dictated.   Orders entered.   Comments: 1. polyarticular synovitis, most likely gout, has hx   2.diabetes,  3 drug use  left knee aspirated 20 cc mildly cloudly fluid, sent for microscopy, injected Rec  colchicine , nsaids,avoid steriods . when flare over, may start allopurinol 100mg  qd . primary MD should be able to follow , if not I can.  Electronic Signatures: Royann ShiversKernodle, Jr., Helen HashimotoGeorge Wallace (MD)  (Signed 12-Oct-15 17:29)  Authored: Brief Consult Note   Last Updated: 12-Oct-15 17:29 by Royann ShiversKernodle, Jr., Helen HashimotoGeorge Wallace (MD)

## 2014-07-21 NOTE — Consult Note (Signed)
General Aspect Primary Cardiologist:  New - seen by Johnny Bridge, MD _____________  Pt profile:   49 y/o male with a h/o crack cocaine and ETOH abuse who presented to [facility] yesterday with chest pain in the setting of recent heavy cocaine usage. _____________  Past Medical History ??? Non-obstructive CAD (coronary artery disease)    a. 2007 MI in [place] in setting of cocaine unitsage ->Cath reportedly nl. ??? HTN (hypertension)  ??? Hyperlipidemia  ??? Borderline diabetes  ??? Cocaine abuse  ??? History of ETOH abuse    a. Cut back beginning in 2014. ??? PAF (paroxysmal atrial fibrillation)    a. Dx in 2007->s/p DCCV x 2 (last "a few yrs ago");  b. Never anticoagulated (CHA2DS2VASc = 1). ??? Obesity   No past surgical history on file.  _____________  Family History ??? Lung cancer Mother  _____________  Social History ??? Marital Status: Single  Social History Main Topics ??? Smoking status: Never Smoker  ??? Smokeless tobacco: Not on file ??? Alcohol Use: Yes    Comment: Previously drank heavily.  Cut back in 2014 but still drinks on occassion. ??? Drug Use: Yes    Comment: Habitual crack cocaine usage. ??? Sexual Activity: Not on file  Social History Narrative  Lives in [place] with his mother.  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 _____________   Present Illness 49 y/o male with the above problem list.   He has a h/o MI in the setting of drug abuse in 2007 and was admitted to [facility] in [place].  He says that he underwent cath @ the time revealing nl cors.  He developed PAF while hospitalized and says that he has had this intermittently over the years.  He has never been on anticoagulation.  He has been cardioverted on two occasions that he can recall - the last "a few yrs ago."  He experiences palpitations about twice/week, usually lasting 10-15 mins and resolving spontaneously.  He now lives in [place]  with his mother.  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  He is not very active @ home.  He continues to use cocaine on a regular basis and has been doing so for several wks in a row now.  Yesterday, he was sitting @ home and watching TV when he had sudden onset of sharp left-sided chest pain w/o associated Ss.  Pain worsened, prompting him to call 911.  EMS transported him to [facility] where ECG was non-acute and troponin was normal.  UDS+ for cocaine.  He was admitted for further evaluation.  Troponins have remained normal and he has had no further chest pain.   Physical Exam:  GEN well developed, pleasant, nad.   HEENT hearing intact to voice, moist oral mucosa   NECK supple  obese, no bruits.   RESP normal resp effort  clear BS   CARD Regular rate and rhythm  Normal, S1, S2  No murmur   ABD denies tenderness  soft  normal BS   EXTR negative cyanosis/clubbing, negative edema   SKIN normal to palpation   NEURO cranial nerves intact, motor/sensory function intact   PSYCH alert, A+O to time, place, person   Review of Systems:  Subjective/Chief Complaint Chest pain, resolved this AM   General: No Complaints   Skin: No Complaints   ENT: No Complaints   Eyes: No Complaints  Neck: No Complaints   Respiratory: No Complaints   Cardiovascular: Chest pain or discomfort  - yesterday.   Gastrointestinal: He saw BRB in the toilet last week.  He denies prior h/o hemorrhoids.   Genitourinary: No Complaints   Vascular: No Complaints   Musculoskeletal: No Complaints   Neurologic: No Complaints   Hematologic: No Complaints   Endocrine: No Complaints   Psychiatric: No Complaints   Review of Systems: All other systems were reviewed and found to be negative   Medications/Allergies Reviewed Medications/Allergies reviewed   Family & Social History:  Family and Social History:  Family History Coronary Artery Disease     Social History negative tobacco   Place of Living Home        MI - Myocardial Infarct:    Hypertension:    Atrial Fibrillation:    Hernia Repair:   Lab Results:  Hepatic:  08-Oct-15 23:52   Bilirubin, Total 0.7  Alkaline Phosphatase 57 (46-116 NOTE: New Reference Range 10/17/13)  SGPT (ALT) 25 (14-63 NOTE: New Reference Range 10/17/13)  SGOT (AST) 18  Total Protein, Serum 7.6  Albumin, Serum 3.5  Routine Chem:  08-Oct-15 23:52   Glucose, Serum  201  BUN  6  Creatinine (comp) 0.97  Sodium, Serum 136  Potassium, Serum 3.5  Chloride, Serum 103  CO2, Serum  19  Calcium (Total), Serum 8.7  Osmolality (calc) 275  eGFR (African American) >60  eGFR (Non-African American) >60 (eGFR values <4m/min/1.73 m2 may be an indication of chronic kidney disease (CKD). Calculated eGFR, using the MRDR Study equation, is useful in  patients with stable renal function. The eGFR calculation will not be reliable in acutely ill patients when serum creatinine is changing rapidly. It is not useful in patients on dialysis. The eGFR calculation may not be applicable to patients at the low and high extremes of body sizes, pregnant women, and vetetarians.)  Anion Gap 14  Urine Drugs:  [id number]  Tricyclic Antidepressant, Ur Qual (comp) NEGATIVE (Result(s) reported on 05 Jan 2014 at 07:53AM.)  Amphetamines, Urine Qual. NEGATIVE  MDMA, Urine Qual. NEGATIVE  Cocaine Metabolite, Urine Qual. POSITIVE  Opiate, Urine qual POSITIVE  Phencyclidine, Urine Qual. NEGATIVE  Cannabinoid, Urine Qual. NEGATIVE  Barbiturates, Urine Qual. NEGATIVE  Benzodiazepine, Urine Qual. NEGATIVE (----------------- The URINE DRUG SCREEN provides only a preliminary, unconfirmed analytical test result and should not be used for non-medical  purposes.  Clinical consideration and professional judgment should be  applied to any positive drug screen result due to possible interfering substances.  A more specific  alternate chemical method must be used in order to obtain a confirmed analytical result.  Gas chromatography/mass spectrometry (GC/MS) is the preferred confirmatory method.)  Methadone, Urine Qual. NEGATIVE  Cardiac:  08-Oct-15 23:52   Troponin I < 0.02 (0.00-0.05 0.05 ng/mL or less: NEGATIVE  Repeat testing in 3-6 hrs  if clinically indicated. >0.05 ng/mL: POTENTIAL  MYOCARDIAL INJURY. Repeat  testing in 3-6 hrs if  clinically indicated. NOTE: An increase or decrease  of 30% or more on serial  testing suggests a  clinically important change)  CK, Total 74 (39-308 NOTE: NEW REFERENCE RANGE  05/01/2013)  CPK-MB, Serum 0.6 (Result(s) reported on 05 Jan 2014 at 12:32AM.)  09-Oct-15 03:54   Troponin I < 0.02 (0.00-0.05 0.05 ng/mL or less: NEGATIVE  Repeat testing in 3-6 hrs  if clinically indicated. >0.05 ng/mL: POTENTIAL  MYOCARDIAL INJURY. Repeat  testing in 3-6 hrs if  clinically indicated. NOTE: An increase  or decrease  of 30% or more on serial  testing suggests a  clinically important change)    07:47   Troponin I < 0.02 (0.00-0.05 0.05 ng/mL or less: NEGATIVE  Repeat testing in 3-6 hrs  if clinically indicated. >0.05 ng/mL: POTENTIAL  MYOCARDIAL INJURY. Repeat  testing in 3-6 hrs if  clinically indicated. NOTE: An increase or decrease  of 30% or more on serial  testing suggests a  clinically important change)  Routine Coag:  08-Oct-15 23:52   Prothrombin 14.0  INR 1.1 (INR reference interval applies to patients on anticoagulant therapy. A single INR therapeutic range for coumarins is not optimal for all indications; however, the suggested range for most indications is 2.0 - 3.0. Exceptions to the INR Reference Range may include: Prosthetic heart valves, acute myocardial infarction, prevention of myocardial infarction, and combinations of aspirin and anticoagulant. The need for a higher or lower target INR must be assessed individually. Reference: The  Pharmacology and Management of the Vitamin K  antagonists: the seventh ACCP Conference on Antithrombotic and Thrombolytic Therapy. OMVEH.2094 Sept:126 (3suppl): N9146842. A HCT value >55% may artifactually increase the PT.  In one study,  the increase was an average of 25%. Reference:  "Effect on Routine and Special Coagulation Testing Values of Citrate Anticoagulant Adjustment in Patients with High HCT Values." American Journal of Clinical Pathology 2006;126:400-405.)   EKG:  EKG Interp. by me   Interpretation EKG shows RSR, 97, left axis deviation, no acute st/t changes.   Radiology Results: XRay:    09-Oct-15 00:17, Chest Portable Single View  Chest Portable Single View   REASON FOR EXAM:    Chest pain  COMMENTS:       PROCEDURE: DXR - DXR PORTABLE CHEST SINGLE VIEW  - Jan 05 2014 12:17AM     CLINICAL DATA:  Acute onset of sharp central chest pain, radiating  down the left arm. Nausea and shortness of breath. Initial  encounter.    EXAM:  PORTABLE CHEST - 1 VIEW    COMPARISON:  None.    FINDINGS:  The lungs are well-aerated and clear. There is no evidence of focal  opacification, pleural effusion or pneumothorax.    The cardiomediastinal silhouette is within normal limits. No acute  osseous abnormalities are seen.     IMPRESSION:  No acute cardiopulmonary process seen.      Electronically Signed   By: Garald Balding M.D.    On: 01/05/2014 00:48       Verified By: JEFFREY . CHANG, M.D.,    No Known Allergies:   Vital Signs/Nurse's Notes: **Vital Signs.:   09-Oct-15 06:05  Vital Signs Type Routine  Temperature Temperature (F) 98.4  Celsius 36.8  Temperature Source oral  Pulse Pulse 100  Respirations Respirations 20  Systolic BP Systolic BP 709  Diastolic BP (mmHg) Diastolic BP (mmHg) 76  Mean BP 89  Pulse Ox % Pulse Ox % 93  Pulse Ox Activity Level  At rest  Oxygen Delivery Room Air/ 21 %  *Intake and Output.:   09-Oct-15 02:35  Current Weight  (lbs) (lbs) 248.4    Impression 1.  Midsternal Chest Pain:   Pt developed midsternal chest pain in the setting of cocaine usage.  Despite prolonged Ss, ECG was non-acute and CE have been negative.  ? vasospasm.  He has a reported prior h/o MI in the setting of drug use in 2007 with reportedly nl cath in [place] @ that time.  He is currently pain  free.   -- No BB in setting of neg CE and cocaine usage.   Add ASA 81.  Check lipids.  He needs to stop using cocaine.  Consider adding CCB. --COuld do a stress test to ruile out ischemia, even a routine treadmill, inpt or outpt if patient agreeable. He does not want to do the stress today, "too tired"  2.  Cocaine Abuse:   Pt needs intensive counseling.  He is using cocaine daily.  Complete cessation advised.  3.  HTN:   BP elevated on admission but better now.  With palps, h/o PAF, and risk of coronary vasospasm in the setting of cocaine abuse, will add dilt cd 139m daily.  4.  HL:   Check lipids.  LFT's wnl.  5.  Borderline DM:   Gluc elevated on admission.  Per IM.  6.  PAF:   Pt reports h/o PAF s/p DCCV x 2 since 2007.  He reports frequent palpitations (2x/wk) lasting about 15 mins and resolving spontaneously.  As above, will add dilt cd 1218mdaily.  CHA2DS2VASc = 1 (HTN). -- Add ASA 81.  Due to noncompliance and report of BRBPR, he's a poor anticoagulation candidate.   Could consider outpt monitoring to document burden of afib, if he keeps f/u appts.  7.  BRBPR:   Pt says that he noted blood in the toilet following a BM last week.  He denies prior h/o hemorrhoids.  Check CBC/stool for FOB.   Electronic Signatures: BeRogelia MireNP)  (Signed 09-Oct-15 10:53)  Authored: General Aspect/Present Illness, History and Physical Exam, Review of System, Home Medications, Labs, EKG , Radiology, Allergies, Vital Signs/Nurse's Notes, Impression/Plan GoIda RogueMD)  (Signed 09-Oct-15 12:21)  Authored: General Aspect/Present  Illness, History and Physical Exam, Review of System, Family & Social History, Past Medical History, EKG , Vital Signs/Nurse's Notes, Impression/Plan  Co-Signer: General Aspect/Present Illness, History and Physical Exam, Review of System, Home Medications, Labs, EKG , Radiology, Allergies, Vital Signs/Nurse's Notes, Impression/Plan   Last Updated: 09-Oct-15 12:21 by GoIda RogueMD)

## 2014-07-21 NOTE — Discharge Summary (Signed)
PATIENT NAME:  [name], [name] MR#:  [id number] DATE OF BIRTH:  22-Nov-1965  DATE OF ADMISSION:  03/23/2014 DATE OF DISCHARGE:  03/28/2014  IDENTIFYING INFORMATION: Mr. [name] is a 49 year old male with a history of alcoholic and cocaine addiction.   CHIEF COMPLAINT: "I'm here for my depression and anxiety, not my substance abuse."  DISCHARGE DIAGNOSES: Major depressive disorder, severe without psychotic features; alcohol use disorder, severe; cocaine use disorder and opioid use disorder. Hypertension, coronary artery disease, gout, obesity, atrial fibrillation, dyslipidemia and diabetes.  DISCHARGE MEDICATIONS: The allopurinol 100 mg once a day at bedtime, aspirin 81 mg delayed release 1 tablet daily, diltiazem 120 mg once a day, gabapentin 100 mg 3 times a day, glipizide 5 mg 1 tablet 2 times a day, lisinopril 1 tablet once a day, lovastatin 20 mg once a day, metformin 1000 mg 1 tablet 2 times a day, citalopram 10 mg 1 tablet in the morning, hydroxyzine 50 mg every 8 hours, lovastatin 20 mg daily, trazodone 50 mg at bedtime.  HOSPITAL COURSE: The patient had escalation in his drinking and also using cocaine. He presented to the hospital complaining of suicidal ideation and a plan to walk in front of a car. This was then related to the stressors of his employer deciding not to rehire him and finding his girlfriend in bed with another man. He also presented with symptoms of depression, poor sleep, poor appetite, anhedonia, feelings of guilt, hopelessness and worthless, poor energy, poor concentration and social isolation. He also endorsed suicidal ideation as discussed above. There is no evidence of any psychotic symptoms.  His mood gradually improved. He began to discuss that he wanted to get treatment for his substance use issues. He began to demonstrate forward thinking and wanting to engage in relatives. He mentioned that he would like to be able to socialize with his son who is returning from  [place]. He also desired medication management to treat anxiety and depressive symptoms. He was started on both citalopram and Vistaril as needed to address these issues. He tolerated these medications. He was given trazodone for insomnia and relayed that his insomnia improved, his appetite improved and he expresses a desire to control his substance use issues.  MENTAL STATUS EXAMINATION: On the day of discharge, the patient was alert and oriented x 4. He was well groomed and had good hygiene. His speech was normal rate and volume. His thought process was linear and goal directed. His thought content: There was no suicidal ideation, no homicidal ideation, no auditory hallucinations, no visual hallucinations. His mood was good. His affect was bright, smiling. His insight and judgment were improved to where he knows he needs to address substance use issues and engage in mental health treatment.  LABORATORY RESULTS: His laboratory results were normal with the exception of an elevated blood glucose of 209 and blood alcohol level on admission of 75.  DISCHARGE DISPOSITION: The patient will follow up with [facility]. He has the contact information for that facility. He will also follow up for medications at [facility] [address]. He has already been to this facility and knows how to use its services.   MEDICATIONS: He has been given a 7 day supple of his medications. In addition, he has 30 day prescriptions for his medications.  FOLLOWUP APPOINTMENTS: As noted above.   ____________________________ Loralie ChampagneAlton [name]. Mayford KnifeWilliams, MD alw:TT D: 03/28/2014 11:01:09 ET T: 03/28/2014 14:55:07 ET JOB#: [id number]  cc: Leory PlowmanAlton [name]. Mayford KnifeWilliams, MD, <Dictator> Brittie Whisnant  Lonia Farber MD ELECTRONICALLY SIGNED 03/29/2014 11:59

## 2014-07-21 NOTE — Discharge Summary (Signed)
PATIENT NAMEJASTON, [name] MR#:  [id number] DATE OF BIRTH:  March 23, 1966  For a detailed note, please check the history and physical done on admission by Dr. Sheryle Hail.   DIAGNOSES AT DISCHARGE: As follows:  1.  Chest pain, likely cocaine mediated, now resolved.  2.  New-onset diabetes with diabetic neuropathy.  3.  Substance abuse.  4.  Hypertension.  5.  Hyperlipidemia.  6.  Polyarticular gout.   DISPOSITION:  The patient is being discharged to home.   DIET:  The patient is being discharged on a low-sodium, low-fat, American Diabetic Association diet.  ACTIVITY: As tolerated.   FOLLOWUP: The patient needs to get himself a primary care physician. The patient is also going to follow up with Dr. Saverio Danker from rheumatology in the next 3 weeks.   DISCHARGE MEDICATIONS: As follows: Lisinopril 5 mg daily, glipizide 5 mg b.i.d., gabapentin 100 mg t.i.d., metformin 1000 mg b.i.d., aspirin 81 mg daily, Cardizem CD 120 mg daily, lovastatin 20 mg daily, allopurinol 100 mg daily, colchicine 0.6 mg b.i.d., Percocet 5/325 one tab q. 6 hours as needed for pain.   CONSULTANTS DURING THE HOSPITAL COURSE: Helen Hashimoto Royann Shivers., MD, from rheumatology, Madolyn Frieze. Jens Som, MD, from cardiology.   PERTINENT STUDIES DONE DURING THE HOSPITAL COURSE: Are as follows: A chest x-ray done on admission showing no acute cardiopulmonary disease. X-ray of the left elbow showing no acute fracture or dislocation. A nuclear medicine myocardial scan done showing no evidence of any acute wall motion abnormality. Negative. Lexiscan stress sestamibi with normal LV function and no ischemia.  EF of 55%.  An x-ray of the left knee showing no acute osseous abnormality.   HOSPITAL COURSE: This is a 49 year old male with medical problems as mentioned above, presented to the hospital on 01/05/2014 due to chest pain and recent history of cocaine abuse.   Problem #1:  Chest pain.  The most likely cause of the patient's chest  pain was demand ischemia from his cocaine abuse. His cardiac enzymes were negative x 3. He had no acute EKG changes. He underwent a Lexiscan myocardial scan which showed no evidence for cardiac ischemia. He is clinically asymptomatic and therefore being discharged home.   Problem #2:  Left elbow pain. This was likely a possible underlying tendinitis and probably related to polyarticular gout. His x-ray showed no evidence of joint effusion or acute fracture. The patient was seen by rheumatology. They recommended starting him on some colchicine, allopurinol which he is currently being discharged on.   Problem #3:  Hyperlipidemia. The patient's cholesterol, total cholesterol is over 200, LDL over 160. He does have diabetes. He was discharged on low-dose statin.  Problem #4:  Hypertension.  The patient has no history of hypertension prior to coming in. He was started on a low-dose ACE and Cardizem and his blood pressure has been well controlled. He will continue that.  Problem #5:  New-onset diabetes. The patient was noted to have a random blood sugar over 200, hemoglobin A1c of 11.2; therefore, he was diagnosed with diabetes.  He had diabetic lifestyle education done while in the hospital. He was started on some oral medications, metformin and glipizide. His blood sugars have remained stable. He is currently being discharged on that. The patient is to buy himself a glucometer and test strips which he says he can purchase and afford.   Problem #6:  Bilateral foot pain. This is likely diabetic neuropathic pain. The patient was started on some Neurontin which he  will continue. He has also been given some as needed Percocet.   Problem #7: Polyarticular gout. Given his elbow pain and right knee pain, he had a rheumatology consult done. The patient was seen by Dr. Gavin PottersKernodle who thought that the patient had polyarticular gout. He did inject his left knee and also aspirated some synovial fluid which was consistent  with gout. At present, the patient is being discharged on colchicine, allopurinol, with close followup with rheumatology as an outpatient.   The patient is a full code.  He is being discharged home.   TIME SPENT: 40 minutes.   ____________________________ Rolly PancakeVivek J. Cherlynn KaiserSainani, MD vjs:LT D: 01/09/2014 14:24:30 ET T: 01/09/2014 16:14:25 ET JOB#: [id number]  cc: Rolly PancakeVivek J. Cherlynn KaiserSainani, MD, <Dictator> Helen HashimotoGeorge Wallace Royann ShiversKernodle Jr., MD Houston SirenVIVEK J Veva Grimley MD ELECTRONICALLY SIGNED 02/05/2014 10:54

## 2014-07-25 NOTE — H&P (Signed)
PATIENT NAMCain [name]:  [name], [name] MR#:  [id number] DATE OF BIRTH:  1965-04-04  DATE OF ADMISSION:  03/23/2014  DATE OF ASSESSMENT: December 26.   REFERRING PHYSICIAN: Emergency Room M.D.   ATTENDING PHYSICIAN:  Zyron Deeley B. Jennet MaduroPucilowska, M.D.   IDENTIFYING DATA: Mr. [name] is a 49 year old male with history of alcoholism and cocaine addiction.   CHIEF COMPLAINT: "I don't know what to do."   HISTORY OF PRESENT ILLNESS:  Mr. [name] has a long history of drinking since the age of 49. In the past three months his drinking escalated and he is also using cocaine. He came to the hospital complaining of suicidal ideation with a plan to walk in front of a car and worsening of depressive symptoms. He has been on worker's comp and his employer decided not to rehire him.  He found his girlfriend with whom he planned to get married in bed with another guy and is homeless now. He has absolutely no support.  He reports poor sleep, decreased appetite, anhedonia, feeling of guilt, hopelessness, worthlessness, poor energy and concentration, social isolation, crying spells and now suicidal ideation. He denies psychotic symptoms, denies symptoms suggestive of bipolar mania.   PAST PSYCHIATRIC HISTORY: He has never been hospitalized. No suicide attempts.  In 2006 he was treated for alcoholism, but does not remember any details.   FAMILY PSYCHIATRIC HISTORY: Multiple family members with alcoholism, his father, his father's brother and his own brother suffers.   PAST MEDICAL HISTORY:  Hypertension, coronary artery disease, atrial fibrillation and gout.   ALLERGIES: No known drug allergies.   MEDICATIONS ON ADMISSION: Allopurinol 100 mg daily, aspirin 81 mg daily, diltiazem 120 mg daily, Neurontin 100 mg 3 times daily, Glucotrol 5 mg twice daily, lisinopril 5 mg daily, lovastatin 20 mg at bedtime, and metformin 2000 mg daily.   SOCIAL HISTORY: As above, he is currently unemployed, homeless and has no support.  REVIEW OF  SYSTEMS:  CONSTITUTIONAL: No fevers or chills. No weight changes.  EYES: No double or blurred vision.  ENT: No hearing grossly. RESPIRATORY:  No shortness of breath or cough.  CARDIOVASCULAR: No chest pain or orthopnea.  GASTROINTESTINAL: No abdominal pain, nausea, vomiting, or diarrhea.  GENITOURINARY: No incontinence or frequency.  ENDOCRINE: No heat or cold intolerance.  LYMPHATIC: No anemia or easy bruising.  INTEGUMENTARY: No acne or rash.  MUSCULOSKELETAL: No muscle or joint pain.  NEUROLOGIC: No tingling or weakness.  PSYCHIATRIC: See history of present illness for details.   PHYSICAL EXAMINATION:  VITAL SIGNS: Blood pressure 102/53, pulse 98, respirations 19, temperature 98.4.  GENERAL: This is a slightly obese middle-aged male in no acute distress.  HEENT: The pupils are equal, round, and reactive to light. Sclerae are anicteric.  NECK: Supple. No thyromegaly.  LUNGS: Clear to auscultation. No dullness to percussion.  HEART: Regular rhythm and rate. No murmurs, rubs, or gallops.  ABDOMEN: Soft, nontender, nondistended. Positive bowel sounds.  MUSCULOSKELETAL: Normal muscle strength in all extremities.  SKIN: No rashes or bruises.  LYMPHATIC: No cervical adenopathy.  NEUROLOGIC: Cranial nerves II through XII are intact.   LABORATORY DATA: Chemistries and are within normal limits except for blood glucose of 209. Blood alcohol level on admission 75. LFTs within normal limits. Urine tox screen positive for cocaine and opioids.  CBC within normal limits. Urinalysis is not suggestive urinary tract infection.  Serum acetaminophen and salicylates are low.   MENTAL STATUS EXAMINATION:  On admission the patient is alert and oriented to person, place, time  and situation. He is slumped in bed, irritable and not easy to interview. He maintains no eye contact. His speech is of normal rhythm, rate and volume. Mood is depressed with irritable affect. Thought process is logical and goal  oriented. Thought content: He still endorses thoughts of hurting himself. No thoughts of hurting others. There are no delusions or paranoia. There are no auditory or visual hallucinations. His cognition is grossly intact. Registration, recall, short and long-term memory are intact. He is of average intelligence and fund of knowledge. His insight and judgment are limited.   SUICIDE RISK ASSESSMENT:  On this is a patient with a long history of substance abuse who became suicidal in the context of multiple social stressors.   INITIAL DIAGNOSES:    AXIS I: Major depressive episode, severe, without psychotic features. Alcohol use disorder, severe, cocaine use disorder, severe, opioid use disorder, severe.  AXIS II: Deferred.  AXIS III: Hypertension, coronary artery disease, gout obesity, atrial fibrillation, dyslipidemia, and diabetes.   PLAN: The patient was admitted to [facility] Medicine unit for safety, stabilization and medication management.  1.  Suicidal ideation. He is on suicide precautions. He is able to contract for safety in the hospital. 2.  Mood.  The patient is uncertain if he needs an antidepressant.  3.  Alcohol detox.  He is on the CIWA protocol. We were at [facility] to ease symptoms of withdrawal.  4.  Medical.  We will continue or other medications as in the community for dyslipidemia, diabetes, hypertension and gout.  5.  Substance abuse treatment.  The patient is not interested in treatment at this point. We will continue to encourage substance abuse treatment as it seems to be a major contributing factor to his recent decompensation.   DISPOSITION: To be established    ____________________________ Ellin Goodie. Jennet Maduro, MD jbp:at D: 03/25/2014 12:09:47 ET T: 03/25/2014 12:33:01 ET JOB#: [id number]  cc: Marinus Eicher B. Jennet Maduro, MD, <Dictator> Shari Prows MD ELECTRONICALLY SIGNED 04/02/2014 19:39

## 2014-07-29 NOTE — Consult Note (Signed)
PATIENT NAMCain [name]:  [name], [name] MR#:  [id number] DATE OF BIRTH:  03-Aug-1965  DATE OF CONSULTATION:  06/29/2014  REFERRING PHYSICIAN:   CONSULTING PHYSICIAN:  Justin SierrasEileen A. Archimedes Harold, PA-C  INDICATION FOR CONSULTATION: Atrial fibrillation and chest pain.   HISTORY OF PRESENT ILLNESS: This is a 49 year old Caucasian male with past medical history of paroxysmal atrial fibrillation, hypertension, hyperlipidemia, diabetes mellitus, who presented to the Emergency Room last night complaining of an episode of atrial fibrillation, followed by severe, sudden left-sided chest pain.  He states chest pain is not usually associated with episodes of atrial fibrillation.  He also admits to concurrent diaphoresis and nausea.   PAST MEDICAL HISTORY: Paroxysmal atrial fibrillation, hypertension, hyperlipidemia, diabetes mellitus, per patient had cardiac catheterization in 2008 with "build up" in arteries.   SOCIAL HISTORY: No tobacco use, daily EtOH abuse.   FAMILY HISTORY: Maternal grandfather had history of coronary artery disease.   REVIEW OF SYSTEMS:  CONSTITUTIONAL: Positive for fatigue and malaise.  PULMONARY: No shortness of breath, wheezes, or cough.  CARDIOVASCULAR: Positive for chest pain and atrial fibrillation.  GASTROINTESTINAL: Nausea, resolved. No vomiting or diarrhea.   PHYSICAL EXAMINATION:  VITAL SIGNS: Temperature 97.8, pulse 93, respirations 20, blood pressure 160/98, pulse oximetry 96% saturation on room air.  GENERAL: A and O x 3, in no acute distress.  PULMONARY: Good air entry bilaterally. No wheezes, rhonchi, or rales.  CARDIOVASCULAR: Normal S1, S2. No audible murmur or.  ABDOMEN: Soft, nontender. Positive bowel sounds.  EXTREMITIES: No pedal edema.   LABORATORY DATA: Troponin negative x 3. Creatinine 0.94.  CTA chest negative for pulmonary embolus or dissection.     ASSESSMENT AND PLAN:  New onset unstable angina, agree with obtaining echocardiogram to evaluate wall motion and ejection  fraction, but with the patient having mildly positive cardiac catheterization in 2008 and severe risk, factors.  We will do cardiac catheterization today. We will continue to follow.   Thank you very much for this consult.     ____________________________ Justin SierrasEileen A. Justin Courseromano, PA-C ear:DT D: 06/29/2014 09:16:10 ET T: 06/29/2014 09:46:21 ET JOB#: [id number]  cc: Marjean DonnaEileen A. Justin Courseromano, PA-C, <Dictator> Justin SierrasEILEEN A Justin Arrasmith PA ELECTRONICALLY SIGNED 07/03/2014 13:33

## 2014-07-29 NOTE — Discharge Summary (Signed)
PATIENT NAMCain [name]:  [name], [name] MR#:  [id number] DATE OF BIRTH:  1966-01-03  DATE OF ADMISSION:  06/29/2014 DATE OF DISCHARGE:  06/29/2014  ADMITTING DIAGNOSIS: Chest pain.   DISCHARGE DIAGNOSES: 1.  Chest pain, noncardiac in origin, status post cardiac catheterization with negative coronaries.  2.  Paroxysmal atrial fibrillation.  3.  Diabetes.  4.  Hypertension.  5.  Hyperlipidemia.  6.  Gout.  7.  Incidental note of lymph nodes noted on the CT of the chest. Needs outpatient pulmonary follow-up.   CONSULTANTS: Dr. Welton FlakesKhan.  DIAGNOSTIC DATA: Cardiac catheterization: Normal coronaries with EF 60%.   Toxic urine drug screen positive for amphetamines, opioids, benzodiazepines.   EKG: Normal sinus rhythm with left anterior fascicular block.   CT angio showed no evidence of aortic dissection. No pulmonary embolism. A 1.5 cm azygoesophageal recess node noted of uncertain etiology. Another 1.3 cm node adjacent to the mid distal esophagus noted.   Chest x-ray negative for low volume chest.   Troponin less than 0.03. WBC 8.8, hemoglobin 14. Glucose 171, BUN 8, creatinine 0.94, sodium 139, potassium 3.3, chloride 101, CO2 25.   HOSPITAL COURSE: Please refer to H and P done by the admitting physician. The patient is a 49 year old white male who presents with complaint of left-sided sharp chest pain. The patient has a history of paroxysmal atrial fibrillation and he developed acute onset of left-sided chest pain sharp in nature with diaphoresis, nausea and vomiting. Due to these symptoms, he was seen in the ED and then we were asked to place him under observation. His serial cardiac enzymes were negative. His symptoms were concerning; therefore, he was taken to cardiac catheterization. Results are as above. The catheterization was negative. The patient needs outpatient follow-up with gastroenterology and pulmonary for the nodules there were noted on the CT scan. At this time, he is stable for discharge.    DISCHARGE MEDICATIONS: Resume metformin as taking previously, metoprolol as taking previously, and he was given Dilaudid 2 mg q. 6 p.r.n. for pain.   DISCHARGE DIET: Low-sodium, low-fat, low-cholesterol, carbohydrate-controlled diet.   DISCHARGE ACTIVITY: As tolerated.   DISCHARGE FOLLOWUP: Follow up with Dr. Welton FlakesKhan in 1 to 2 weeks, [facility], as well as gastroenterology in 2 to 4 weeks.   TIME SPENT ON DISCHARGE:  45 minutes.   ____________________________ Lacie ScottsShreyang H. Allena KatzPatel, MD shp:sb D: 06/29/2014 14:25:30 ET T: 06/29/2014 14:56:13 ET JOB#: [id number]  cc: Charlisa Cham H. Allena KatzPatel, MD, <Dictator> Charise CarwinSHREYANG H Dnya Hickle MD ELECTRONICALLY SIGNED 07/04/2014 20:20

## 2014-07-29 NOTE — H&P (Signed)
PATIENT [name], [name] MR#:  [id number] DATE OF BIRTH:  [birth year]  DATE OF ADMISSION:  06/29/2014  CHIEF COMPLAINT: Chest pain.   HISTORY OF PRESENT ILLNESS: This a 49 year old male who presents tonight with a complaint of left sharp chest pain. The patient has a known history of paroxysmal atrial fibrillation. He states that he can tell when he goes into an atrial fibrillation episode. Shortly after feeling like he was going into one of these episodes tonight, he developed this left-sided chest pain, which is sharp in nature, non-radiating, associated with some diaphoresis and nausea but no vomiting. No other associated symptoms. This pain occurred while the patient was at rest. It is non-exertional. The patient states that he recently had a cardioversion for his atrial fibrillation at [facility] last month. He states that he had a nuclear stress test performed at that time, which was negative. In the ED here, the patient was found to have continued pain which improved some with some IV narcotic administration. First set of enzymes was negative as well as CTA was negative for dissection and PE. Hospitalists were called for admission for rule out ACS.   PRIMARY CARE PHYSICIAN: Nonlocal.   PAST MEDICAL HISTORY: Diabetes mellitus, atrial fibrillation, hypertension, hyperlipidemia, gout.   MEDICATIONS: Lopressor 25 mg b.i.d., metformin 1000 mg b.i.d.   PAST SURGICAL HISTORY: Umbilical hernia repair.   ALLERGIES: No known drug allergies.   FAMILY HISTORY: Hypertension, CAD, CVA, diabetes, cancer.   SOCIAL HISTORY: Nonsmoker, daily alcohol use 6 or 7 beers a day, and he states that he has a prior illicit drug use of cocaine but has not used any that for the past year.  REVIEW OF SYSTEMS:   CONSTITUTIONAL: Denies fever, fatigue, weakness,  EYES: No blurred or double vision, pain or redness.   EAR, NOSE, AND THROAT: No ear pain, hearing loss, difficulty swallowing.  RESPIRATORY: No cough,  hemoptysis, or dyspnea.  CARDIOVASCULAR: Endorses sharp left-sided chest pain. No edema or palpitations.  GASTROINTESTINAL: No nausea, vomiting, or diarrhea. No abdominal pain. No constipation.  GENITOURINARY: No dysuria, hematuria, or frequency.  ENDOCRINE: No nocturia, thyroid problems, heat or cold intolerance. HEMATOLOGIC AND LYMPHATIC: No easy bruising, bleeding, or swollen glands.  INTEGUMENTARY: No acne, rash, or lesion.  MUSCULOSKELETAL: No acute arthritis, joint swelling, or gout.  NEUROLOGIC: No numbness, weakness, or headache.  PSYCHIATRIC: No anxiety, insomnia, depression.   PHYSICAL EXAMINATION: VITAL SIGNS: Blood pressure 141/99, pulse 84, temperature 98.7, respirations 28 and 95% oxygen saturations on room air.  GENERAL: This is a well-nourished male lying supine in bed in no acute distress.  HEENT: Pupils equal, round, reactive to light and accommodation. Extraocular movements intact. No scleral icterus. Moist mucosal membranes.  NECK: Thyroid is not enlarged. Neck is supple with no mass, nontender. No cervical adenopathy. No JVD noted. RESPIRATIONS: Clear to auscultation bilaterally with no rales, rhonchi, or wheezing. No respiratory distress.  CARDIOVASCULAR: Regular rate and rhythm with no murmurs, rubs, or gallops on auscultation. Good pedal pulses with no lower extremity edema.  ABDOMEN: Soft, nontender, nondistended good bowel sounds.  MUSCULOSKELETAL: Muscular strength 5/5 in all 4 extremities. Full spontaneous range of motion throughout. No cyanosis or clubbing.  SKIN: No rash or lesions. Skin is warm, dry, and intact.  LYMPHATIC: No adenopathy.  NEUROLOGIC: Cranial nerves intact. Sensation intact throughout. No dysarthria or aphasia.  PSYCHIATRIC: Alert and oriented x3, cooperative, not confused.   LABORATORY DATA: White count 8.8, hemoglobin 14, hematocrit 42.4, platelets 209,000, sodium 139, potassium  3.3, chloride 101, CO2 of 25 BUN 8, creatinine 0.94, glucose  171. BNP 21. Troponin less than 0.03. D-dimer 235.   RADIOLOGY: Chest x-ray negative, low volume chest. CT of chest as reported per HPI showed no evidence of aortic dissection, no evidence of aneurysmal dilatation, no calcific atherosclerotic disease, and no evidence of central pulmonary embolus.   ASSESSMENT AND PLAN:  1. Chest pain. We will rule out ACS in this gentleman. We will continue to trend his enzymes for total 3 sets. We will get an echocardiogram to evaluate his atrial fibrillation and potential causes of chest pain. We will also get a cardiology consult just to get their opinion on what might be causing his pain with this strange history of atrial fibrillation and questionable onset of chest pain with a potential episode of atrial fibrillation, but without the patient currently being in atrial fibrillation and persistent chest pain despite ruling out dissection and PE and despite her recent negative stress test. 2. Diabetes mellitus. We will put the patient on sliding scale insulin while here and monitor his blood glucose. We will also check a hemoglobin A1c.  3. Hypertension: The patient's blood pressure is a little bit high, although he endorses being in pain so will have some IV p.r.n. medications to control his blood pressure to keep it below a goal of less than 160/100.  4. Paroxysmal atrial fibrillation. The patient is on Lopressor for rate control I suppose, and also potentially for rhythm control. We will continue his home dose of Lopressor here.  5. Deep vein thrombosis prophylaxis with subcutaneous Lovenox.   This patient is FULL CODE.   TIME SPENT ON THIS ADMISSION: 50 minutes.    ____________________________ Candace Cruiseavid F. Anne HahnWillis, MD dfw:jh D: 06/29/2014 04:48:10 ET T: 06/29/2014 06:10:04 ET JOB#: [id number]  cc: Candace Cruiseavid F. Anne HahnWillis, MD, <Dictator> Diavian Furgason Scotty CourtF Johnie Stadel MD ELECTRONICALLY SIGNED 06/30/2014 3:05

## 2014-09-22 ENCOUNTER — Emergency Department

## 2014-09-22 ENCOUNTER — Emergency Department
Admission: EM | Admit: 2014-09-22 | Discharge: 2014-09-22 | Attending: Emergency Medicine | Admitting: Emergency Medicine

## 2014-09-22 ENCOUNTER — Encounter: Payer: Self-pay | Admitting: Emergency Medicine

## 2014-09-22 ENCOUNTER — Other Ambulatory Visit: Payer: Self-pay

## 2014-09-22 DIAGNOSIS — R609 Edema, unspecified: Secondary | ICD-10-CM

## 2014-09-22 DIAGNOSIS — R0789 Other chest pain: Secondary | ICD-10-CM

## 2014-09-22 DIAGNOSIS — E119 Type 2 diabetes mellitus without complications: Secondary | ICD-10-CM | POA: Diagnosis not present

## 2014-09-22 DIAGNOSIS — I10 Essential (primary) hypertension: Secondary | ICD-10-CM | POA: Insufficient documentation

## 2014-09-22 DIAGNOSIS — R079 Chest pain, unspecified: Secondary | ICD-10-CM | POA: Insufficient documentation

## 2014-09-22 DIAGNOSIS — R600 Localized edema: Secondary | ICD-10-CM | POA: Diagnosis not present

## 2014-09-22 HISTORY — DX: Type 2 diabetes mellitus without complications: E11.9

## 2014-09-22 LAB — COMPREHENSIVE METABOLIC PANEL
ALK PHOS: 61 U/L (ref 38–126)
ALT: 77 U/L — ABNORMAL HIGH (ref 17–63)
AST: 56 U/L — ABNORMAL HIGH (ref 15–41)
Albumin: 3.7 g/dL (ref 3.5–5.0)
Anion gap: 10 (ref 5–15)
BILIRUBIN TOTAL: 0.6 mg/dL (ref 0.3–1.2)
BUN: 14 mg/dL (ref 6–20)
CALCIUM: 9.4 mg/dL (ref 8.9–10.3)
CHLORIDE: 103 mmol/L (ref 101–111)
CO2: 23 mmol/L (ref 22–32)
Creatinine, Ser: 0.89 mg/dL (ref 0.61–1.24)
GFR calc non Af Amer: >60 mL/min (ref >60)
Glucose, Bld: 315 mg/dL — ABNORMAL HIGH (ref 65–99)
POTASSIUM: 4 mmol/L (ref 3.5–5.1)
SODIUM: 136 mmol/L (ref 135–145)
Total Protein: 7.7 g/dL (ref 6.5–8.1)

## 2014-09-22 LAB — TROPONIN I

## 2014-09-22 LAB — CBC
HEMATOCRIT: 44.4 % (ref 40.0–52.0)
Hemoglobin: 14.7 g/dL (ref 13.0–18.0)
MCH: 30.5 pg (ref 26.0–34.0)
MCHC: 33.1 g/dL (ref 32.0–36.0)
MCV: 92.1 fL (ref 80.0–100.0)
Platelets: 354 10*3/uL (ref 150–440)
RBC: 4.82 MIL/uL (ref 4.40–5.90)
RDW: 13.9 % (ref 11.5–14.5)
WBC: 6.8 10*3/uL (ref 3.8–10.6)

## 2014-09-22 MED ORDER — FUROSEMIDE 20 MG PO TABS: 40.0000 mg | ORAL_TABLET | Freq: Two times a day (BID) | ORAL | Status: DC

## 2014-09-22 MED ORDER — OXYCODONE-ACETAMINOPHEN 5-325 MG PO TABS
ORAL_TABLET | ORAL | Status: AC
  Administered 2014-09-22: 1 via ORAL
  Filled 2014-09-22: qty 1

## 2014-09-22 MED ORDER — FUROSEMIDE 40 MG PO TABS
40.0000 mg | ORAL_TABLET | Freq: Once | ORAL | Status: AC
  Administered 2014-09-22: 40 mg via ORAL

## 2014-09-22 MED ORDER — FUROSEMIDE 40 MG PO TABS
ORAL_TABLET | ORAL | Status: AC
  Administered 2014-09-22: 40 mg via ORAL
  Filled 2014-09-22: qty 1

## 2014-09-22 MED ORDER — IOHEXOL 350 MG/ML SOLN
75.0000 mL | Freq: Once | INTRAVENOUS | Status: AC | PRN
  Administered 2014-09-22: 75 mL via INTRAVENOUS

## 2014-09-22 MED ORDER — OXYCODONE-ACETAMINOPHEN 5-325 MG PO TABS
1.0000 | ORAL_TABLET | Freq: Once | ORAL | Status: AC
  Administered 2014-09-22: 1 via ORAL

## 2014-09-22 NOTE — ED Notes (Signed)
Pt to ED from [facility], reports he has had bilateral lower ext edema with redness and pain since Tuesday, states today while getting lunch tray he had a sudden onset of midsternal cp with nausea, sob, and lightheadedness, states he was given 2 nitro [facility] the jail that improved chest pain, denies any pain at present

## 2014-09-22 NOTE — ED Provider Notes (Signed)
[facility] Emergency Department Provider Note  ____________________________________________  Time seen: 1545  I have reviewed the triage vital signs and the nursing notes.   HISTORY  Chief Complaint Chest Pain and Leg Swelling     HPI [name] is a 49 y.o. male who has been incarcerated for approximately one week. He has developed swelling in both feet into the ankles. The feet are sore and tender. He is having some discomfort when he walks due to this pressure and discomfort. He has not had swelling like this before. No history of lymphedema or of DVT. He does have an unclear history of ischemic heart disease with hospitalization and cardiac catheterization [facility] in 2014..  Today, while returning his tray at lunch, he had sudden onset severe chest pain. He felt a little bit weak though he might pass out, but he says he did not. He dropped to his knees due to the severe pain. He was treated with nitroglycerin at the prison. This appeared to help him some. His pain is now mild.     Past Medical History  Diagnosis Date  . CAD (coronary artery disease)     a. 2007 MI in [place], [place] ->Cath reportedly nl.  Marland Kitchen HTN (hypertension)   . Hyperlipidemia   . Borderline diabetes   . Cocaine abuse   . History of ETOH abuse     a. Cut back beginning in 2014.  Marland Kitchen PAF (paroxysmal atrial fibrillation)     a. Dx in 2007->s/p DCCV x 2 (last "a few yrs ago");  b. Never anticoagulated (CHA2DS2VASc = 1).  . Obesity   . Diabetes mellitus without complication     There are no active problems to display for this patient.   History reviewed. No pertinent past surgical history.  No current outpatient prescriptions on file.  Allergies Review of patient's allergies indicates no known allergies.  Family History  Problem Relation Age of Onset  . Lung cancer Mother     Social History History  Substance Use Topics  . Smoking status: Never Smoker   .  Smokeless tobacco: Not on file  . Alcohol Use: Yes     Comment: Previously drank heavily.  Cut back in 2014 but still drinks on occassion.    Review of Systems  Constitutional: Negative for fever. ENT: Negative for sore throat. Cardiovascular: Positive for chest pain. See history of present illness Respiratory: Negative for shortness of breath. Gastrointestinal: Negative for abdominal pain, vomiting and diarrhea. Genitourinary: Negative for dysuria. Musculoskeletal: No myalgias or injuries. Notable for edema and tenderness in both feet. See history of present illness Skin: Negative for rash. Neurological: Negative for headaches   10-point ROS otherwise negative.  ____________________________________________   PHYSICAL EXAM:  VITAL SIGNS: ED Triage Vitals  Enc Vitals Group     BP 09/22/14 1343 126/98 mmHg     Pulse Rate 09/22/14 1341 79     Resp 09/22/14 1341 18     Temp 09/22/14 1341 98.6 F (37 C)     Temp Source 09/22/14 1341 Oral     SpO2 09/22/14 1341 97 %     Weight 09/22/14 1341 245 lb (111.131 kg)     Height 09/22/14 1341 6' (1.829 m)     Head Cir --      Peak Flow --      Pain Score 09/22/14 1342 8     Pain Loc --      Pain Edu? --  Excl. in GC? --     Constitutional:  Alert and oriented. Appears a little uncomfortable but is in no acute distress.Marland Kitchen ENT   Head: Normocephalic and atraumatic.   Nose: No congestion/rhinnorhea.   Mouth/Throat: Mucous membranes are moist. Cardiovascular: Normal rate, regular rhythm, no murmur noted Respiratory:  Normal respiratory effort, no tachypnea.    Breath sounds are clear and equal bilaterally.  Gastrointestinal: Soft and nontender. No distention.  Back: No muscle spasm, no tenderness, no CVA tenderness. Musculoskeletal: No deformity noted. Nontender with normal range of motion in all extremities. Bilateral edema in both feet into the ankles. Normal positive pulse in both feet. Neurologic:  Normal speech  and language. No gross focal neurologic deficits are appreciated.  Skin:  Skin is warm, dry. No rash noted. Psychiatric: Mood and affect are normal. Speech and behavior are normal.  ____________________________________________    LABS (pertinent positives/negatives)  CBC: Normal Metabolic panel: Glucose 315, AST 56, ALT 77, otherwise normal. Troponin, normal  ____________________________________________   EKG  ED ECG REPORT I, Keina Mutch W, the attending physician, personally viewed and interpreted this ECG.   Date: 09/22/2014  EKG Time: 1339  Rate: 80  Rhythm: Normal sinus rhythm  Axis: Left axis deviation  Intervals: Prolonged QTC at 468  ST&T Change: Flat T in lead 3 and aVF   ____________________________________________    RADIOLOGY  Doppler ultrasound bilateral legs: Normal no DVT CT angiogram chest evaluate for PE: No PE. Noted adenopathy that has been present before.  ____________________________________________ ____________________________________________   INITIAL IMPRESSION / ASSESSMENT AND PLAN / ED COURSE  Pertinent labs & imaging results that were available during my care of the patient were reviewed by me and considered in my medical decision making (see chart for details).  Patient with bilateral edema in his legs and then chest pain prior to arrival. The patient does not have a history of DVTs and I think the chance of a DVT is not high, but given no other explanation currently for the edema in his feet and for the episode of chest pain, we will rule out DVT and pulmonary emboli.  ----------------------------------------- 7:07 PM on 09/22/2014 -----------------------------------------  Dopplers are negative, CT is negative for PE. Initial troponin is negative. Reassessment of patient at this time finds him overall comfortable. It's unclear why he is having the notable edema in both feet. This may be lymphedema that is not responding to Lasix.  Because of the chest pain he had, I will draw one more troponin. I've explained this to the patient and he understands and agrees. He is very pleasant and personable.  ----------------------------------------- 8:43 PM on 09/22/2014 -----------------------------------------  Second Troponin is negative. Will discharge the patient back to the prison with further evaluation for his peripheral edema pending.  ____________________________________________   FINAL CLINICAL IMPRESSION(S) / ED DIAGNOSES  Final diagnoses:  Peripheral edema  Chest pain of uncertain etiology      Darien Ramus, MD 09/22/14 2047

## 2014-09-22 NOTE — Discharge Instructions (Signed)
It is not clear what is causing your current peripheral edema or the chest pain he had earlier today. Your Doppler ultrasounds were negative (no blood clot) and a CT scan through your chest was negative for pulmonary emboli. Cardiac enzyme test was measured twice, both were negative.  [facility] You may need further evaluation by internal medicine. Return to the emergency department if things worsen or you have other urgent concerns.  Chest Pain (Nonspecific) It is often hard to give a diagnosis for the cause of chest pain. There is always a chance that your pain could be related to something serious, such as a heart attack or a blood clot in the lungs. You need to follow up with your doctor. HOME CARE  If antibiotic medicine was given, take it as directed by your doctor. Finish the medicine even if you start to feel better.  For the next few days, avoid activities that bring on chest pain. Continue physical activities as told by your doctor.  Do not use any tobacco products. This includes cigarettes, chewing tobacco, and e-cigarettes.  Avoid drinking alcohol.  Only take medicine as told by your doctor.  Follow your doctor's suggestions for more testing if your chest pain does not go away.  Keep all doctor visits you made. GET HELP IF:  Your chest pain does not go away, even after treatment.  You have a rash with blisters on your chest.  You have a fever. GET HELP RIGHT AWAY IF:   You have more pain or pain that spreads to your arm, neck, jaw, back, or belly (abdomen).  You have shortness of breath.  You cough more than usual or cough up blood.  You have very bad back or belly pain.  You feel sick to your stomach (nauseous) or throw up (vomit).  You have very bad weakness.  You pass out (faint).  You have chills. This is an emergency. Do not wait to see if the problems will go away. Call your local emergency services (911 in [place] Do not drive  yourself to the hospital. MAKE SURE YOU:   Understand these instructions.  Will watch your condition.  Will get help right away if you are not doing well or get worse. Document Released: 09/02/2007 Document Revised: 03/21/2013 Document Reviewed: 09/02/2007 [facility] Patient Information 2015 [place], [place]. This information is not intended to replace advice given to you by your health care provider. Make sure you discuss any questions you have with your health care provider.

## 2014-12-27 ENCOUNTER — Encounter: Payer: Self-pay | Admitting: Emergency Medicine

## 2014-12-27 ENCOUNTER — Emergency Department
Admission: EM | Admit: 2014-12-27 | Discharge: 2014-12-27 | Disposition: A | Discharge status: Other Acute Inpatient Hospital | Payer: No Typology Code available for payment source | Attending: Emergency Medicine | Admitting: Emergency Medicine

## 2014-12-27 ENCOUNTER — Inpatient Hospital Stay
Admission: EM | Admit: 2014-12-27 | Discharge: 2015-01-01 | DRG: 885 | Disposition: A | Discharge status: Home / Self Care | Payer: No Typology Code available for payment source | Source: Intra-hospital | Attending: Psychiatry | Admitting: Psychiatry

## 2014-12-27 DIAGNOSIS — Z915 Personal history of self-harm: Secondary | ICD-10-CM

## 2014-12-27 DIAGNOSIS — Z9114 Patient's other noncompliance with medication regimen: Secondary | ICD-10-CM

## 2014-12-27 DIAGNOSIS — R000 Tachycardia, unspecified: Secondary | ICD-10-CM | POA: Insufficient documentation

## 2014-12-27 DIAGNOSIS — I10 Essential (primary) hypertension: Secondary | ICD-10-CM | POA: Insufficient documentation

## 2014-12-27 DIAGNOSIS — M109 Gout, unspecified: Secondary | ICD-10-CM | POA: Diagnosis present

## 2014-12-27 DIAGNOSIS — F10239 Alcohol dependence with withdrawal, unspecified: Secondary | ICD-10-CM | POA: Diagnosis present

## 2014-12-27 DIAGNOSIS — E119 Type 2 diabetes mellitus without complications: Secondary | ICD-10-CM | POA: Diagnosis present

## 2014-12-27 DIAGNOSIS — R45851 Suicidal ideations: Secondary | ICD-10-CM | POA: Diagnosis present

## 2014-12-27 DIAGNOSIS — Z9119 Patient's noncompliance with other medical treatment and regimen: Secondary | ICD-10-CM

## 2014-12-27 DIAGNOSIS — F10939 Alcohol use, unspecified with withdrawal, unspecified: Secondary | ICD-10-CM

## 2014-12-27 DIAGNOSIS — G4700 Insomnia, unspecified: Secondary | ICD-10-CM | POA: Diagnosis present

## 2014-12-27 DIAGNOSIS — E669 Obesity, unspecified: Secondary | ICD-10-CM | POA: Diagnosis present

## 2014-12-27 DIAGNOSIS — F332 Major depressive disorder, recurrent severe without psychotic features: Secondary | ICD-10-CM | POA: Diagnosis present

## 2014-12-27 DIAGNOSIS — F1490 Cocaine use, unspecified, uncomplicated: Secondary | ICD-10-CM | POA: Diagnosis present

## 2014-12-27 DIAGNOSIS — E785 Hyperlipidemia, unspecified: Secondary | ICD-10-CM | POA: Diagnosis present

## 2014-12-27 DIAGNOSIS — Z801 Family history of malignant neoplasm of trachea, bronchus and lung: Secondary | ICD-10-CM | POA: Diagnosis not present

## 2014-12-27 DIAGNOSIS — Z79899 Other long term (current) drug therapy: Secondary | ICD-10-CM | POA: Insufficient documentation

## 2014-12-27 DIAGNOSIS — I4891 Unspecified atrial fibrillation: Secondary | ICD-10-CM

## 2014-12-27 DIAGNOSIS — F1410 Cocaine abuse, uncomplicated: Secondary | ICD-10-CM

## 2014-12-27 DIAGNOSIS — F32A Depression, unspecified: Secondary | ICD-10-CM

## 2014-12-27 DIAGNOSIS — I252 Old myocardial infarction: Secondary | ICD-10-CM

## 2014-12-27 DIAGNOSIS — F1590 Other stimulant use, unspecified, uncomplicated: Secondary | ICD-10-CM | POA: Diagnosis present

## 2014-12-27 DIAGNOSIS — Z6832 Body mass index (BMI) 32.0-32.9, adult: Secondary | ICD-10-CM | POA: Diagnosis not present

## 2014-12-27 DIAGNOSIS — F1020 Alcohol dependence, uncomplicated: Secondary | ICD-10-CM

## 2014-12-27 DIAGNOSIS — F329 Major depressive disorder, single episode, unspecified: Secondary | ICD-10-CM | POA: Insufficient documentation

## 2014-12-27 LAB — URINE DRUG SCREEN, QUALITATIVE (ARMC ONLY)
AMPHETAMINES, UR SCREEN: NOT DETECTED
BARBITURATES, UR SCREEN: NOT DETECTED
Benzodiazepine, Ur Scrn: NOT DETECTED
COCAINE METABOLITE, UR ~~LOC~~: POSITIVE — AB
Cannabinoid 50 Ng, Ur ~~LOC~~: NOT DETECTED
MDMA (ECSTASY) UR SCREEN: NOT DETECTED
METHADONE SCREEN, URINE: NOT DETECTED
OPIATE, UR SCREEN: NOT DETECTED
Phencyclidine (PCP) Ur S: NOT DETECTED
TRICYCLIC, UR SCREEN: NOT DETECTED

## 2014-12-27 LAB — CBC
HEMATOCRIT: 49.1 % (ref 40.0–52.0)
HEMOGLOBIN: 16.5 g/dL (ref 13.0–18.0)
MCH: 29 pg (ref 26.0–34.0)
MCHC: 33.6 g/dL (ref 32.0–36.0)
MCV: 86.5 fL (ref 80.0–100.0)
Platelets: 279 10*3/uL (ref 150–440)
RBC: 5.68 MIL/uL (ref 4.40–5.90)
RDW: 15.5 % — ABNORMAL HIGH (ref 11.5–14.5)
WBC: 8.4 10*3/uL (ref 3.8–10.6)

## 2014-12-27 LAB — TROPONIN I: Troponin I: <0.03 ng/mL (ref <0.031)

## 2014-12-27 LAB — COMPREHENSIVE METABOLIC PANEL
ALBUMIN: 4.4 g/dL (ref 3.5–5.0)
ALK PHOS: 64 U/L (ref 38–126)
ALT: 19 U/L (ref 17–63)
ANION GAP: 12 (ref 5–15)
AST: 26 U/L (ref 15–41)
BUN: 5 mg/dL — ABNORMAL LOW (ref 6–20)
CHLORIDE: 105 mmol/L (ref 101–111)
CO2: 22 mmol/L (ref 22–32)
Calcium: 8.9 mg/dL (ref 8.9–10.3)
Creatinine, Ser: 0.87 mg/dL (ref 0.61–1.24)
GFR calc non Af Amer: >60 mL/min (ref >60)
GLUCOSE: 175 mg/dL — AB (ref 65–99)
Potassium: 3.8 mmol/L (ref 3.5–5.1)
Sodium: 139 mmol/L (ref 135–145)
Total Bilirubin: 0.6 mg/dL (ref 0.3–1.2)
Total Protein: 8.4 g/dL — ABNORMAL HIGH (ref 6.5–8.1)

## 2014-12-27 LAB — ACETAMINOPHEN LEVEL

## 2014-12-27 LAB — SALICYLATE LEVEL

## 2014-12-27 LAB — ETHANOL: Alcohol, Ethyl (B): 357 mg/dL (ref <5)

## 2014-12-27 MED ORDER — METOPROLOL TARTRATE 25 MG PO TABS: 25.0000 mg | ORAL_TABLET | Freq: Two times a day (BID) | ORAL | Status: DC

## 2014-12-27 MED ORDER — LORAZEPAM 1 MG PO TABS
1.0000 mg | ORAL_TABLET | Freq: Once | ORAL | Status: AC
  Administered 2014-12-27: 1 mg via ORAL
  Filled 2014-12-27: qty 1

## 2014-12-27 MED ORDER — CITALOPRAM HYDROBROMIDE 20 MG PO TABS
20.0000 mg | ORAL_TABLET | Freq: Every day | ORAL | Status: DC
  Administered 2014-12-27: 20 mg via ORAL
  Filled 2014-12-27: qty 1

## 2014-12-27 MED ORDER — LORAZEPAM 2 MG/ML IJ SOLN: 0.0000 mg | Freq: Two times a day (BID) | INTRAMUSCULAR | Status: DC

## 2014-12-27 MED ORDER — FOLIC ACID 1 MG PO TABS
1.0000 mg | ORAL_TABLET | Freq: Every day | ORAL | Status: DC
  Filled 2014-12-27: qty 1

## 2014-12-27 MED ORDER — LORAZEPAM 2 MG/ML IJ SOLN: 1.0000 mg | Freq: Four times a day (QID) | INTRAMUSCULAR | Status: DC | PRN

## 2014-12-27 MED ORDER — CITALOPRAM HYDROBROMIDE 20 MG PO TABS
20.0000 mg | ORAL_TABLET | Freq: Every day | ORAL | Status: DC
  Administered 2014-12-28 – 2015-01-01 (×5): 20 mg via ORAL
  Filled 2014-12-27 (×5): qty 1

## 2014-12-27 MED ORDER — LORAZEPAM 2 MG/ML IJ SOLN: 0.0000 mg | Freq: Four times a day (QID) | INTRAMUSCULAR | Status: DC

## 2014-12-27 MED ORDER — ONDANSETRON HCL 4 MG/2ML IJ SOLN
4.0000 mg | Freq: Once | INTRAMUSCULAR | Status: AC
  Administered 2014-12-27: 4 mg via INTRAVENOUS
  Filled 2014-12-27: qty 2

## 2014-12-27 MED ORDER — VITAMIN B-1 100 MG PO TABS
100.0000 mg | ORAL_TABLET | Freq: Every day | ORAL | Status: DC
  Filled 2014-12-27: qty 1

## 2014-12-27 MED ORDER — LORAZEPAM 2 MG PO TABS
0.0000 mg | ORAL_TABLET | Freq: Four times a day (QID) | ORAL | Status: DC
Start: 2014-12-27 — End: 2014-12-27

## 2014-12-27 MED ORDER — ADULT MULTIVITAMIN W/MINERALS CH
1.0000 | ORAL_TABLET | Freq: Every day | ORAL | Status: DC
  Administered 2014-12-28 – 2014-12-30 (×3): 1 via ORAL
  Filled 2014-12-27 (×3): qty 1

## 2014-12-27 MED ORDER — MAGNESIUM HYDROXIDE 400 MG/5ML PO SUSP: 30.0000 mL | Freq: Every day | ORAL | Status: DC | PRN

## 2014-12-27 MED ORDER — LORAZEPAM 2 MG PO TABS: 0.0000 mg | ORAL_TABLET | Freq: Two times a day (BID) | ORAL | Status: DC

## 2014-12-27 MED ORDER — VITAMIN B-1 100 MG PO TABS
100.0000 mg | ORAL_TABLET | Freq: Every day | ORAL | Status: DC
  Administered 2014-12-27: 100 mg via ORAL
  Filled 2014-12-27: qty 1

## 2014-12-27 MED ORDER — METFORMIN HCL 500 MG PO TABS
500.0000 mg | ORAL_TABLET | Freq: Two times a day (BID) | ORAL | Status: DC
  Administered 2014-12-27: 500 mg via ORAL
  Filled 2014-12-27: qty 1

## 2014-12-27 MED ORDER — LORAZEPAM 1 MG PO TABS: 1.0000 mg | ORAL_TABLET | Freq: Four times a day (QID) | ORAL | Status: DC | PRN

## 2014-12-27 MED ORDER — VITAMIN B-1 100 MG PO TABS: 100.0000 mg | ORAL_TABLET | Freq: Every day | ORAL | Status: DC

## 2014-12-27 MED ORDER — LORAZEPAM 2 MG PO TABS
0.0000 mg | ORAL_TABLET | Freq: Four times a day (QID) | ORAL | Status: DC
  Administered 2014-12-27: 4 mg via ORAL
  Filled 2014-12-27: qty 2
  Filled 2014-12-27: qty 1

## 2014-12-27 MED ORDER — THIAMINE HCL 100 MG/ML IJ SOLN: 100.0000 mg | Freq: Every day | INTRAMUSCULAR | Status: DC

## 2014-12-27 MED ORDER — METFORMIN HCL 500 MG PO TABS
500.0000 mg | ORAL_TABLET | Freq: Two times a day (BID) | ORAL | Status: DC
  Filled 2014-12-27: qty 1

## 2014-12-27 MED ORDER — METOPROLOL TARTRATE 25 MG PO TABS
25.0000 mg | ORAL_TABLET | Freq: Two times a day (BID) | ORAL | Status: DC
  Administered 2014-12-27: 25 mg via ORAL
  Filled 2014-12-27 (×2): qty 1

## 2014-12-27 MED ORDER — SODIUM CHLORIDE 0.9 % IV BOLUS (SEPSIS)
1000.0000 mL | Freq: Once | INTRAVENOUS | Status: AC
  Administered 2014-12-27: 1000 mL via INTRAVENOUS

## 2014-12-27 MED ORDER — ACETAMINOPHEN 325 MG PO TABS
650.0000 mg | ORAL_TABLET | Freq: Four times a day (QID) | ORAL | Status: DC | PRN
  Administered 2014-12-27 – 2014-12-30 (×2): 650 mg via ORAL
  Filled 2014-12-27 (×2): qty 2

## 2014-12-27 MED ORDER — ALUM & MAG HYDROXIDE-SIMETH 200-200-20 MG/5ML PO SUSP
30.0000 mL | ORAL | Status: DC | PRN
Start: 2014-12-27 — End: 2015-01-01

## 2014-12-27 NOTE — Progress Notes (Signed)
LCSW consulted with TTS and patient will be admitted to in patient.

## 2014-12-27 NOTE — ED Notes (Signed)
lt

## 2014-12-27 NOTE — ED Notes (Signed)
BEHAVIORAL HEALTH ROUNDING Patient sleeping: No. Patient alert and oriented: yes Behavior appropriate: Yes.  ; If no, describe: n/a Nutrition and fluids offered: Yes  Toileting and hygiene offered: Yes  Sitter present: yes Law enforcement present: Yes  

## 2014-12-27 NOTE — BH Assessment (Signed)
Assessment Note  [name] is an 49 y.o. male who presents to the ER under IVC. Patient called 911, requesting help for his depression and having thoughts of suicide. Patient states, he has no specific plan but has had ongoing thoughts of dying for the last two months. Over the last week, he has had an increase of SI.  Patient was unable to give the specific causes of it. He states, "My life just sucks. It's everything. Nothing works out for me."  Patient recently moved in with is cousin, approximately two months ago. Prior to that, he was living in [place], [place], with his girlfriend. They were together for approximately one year. Per his report, they came to a mutual agreement, about ending the relationship. Patient also states, he is currently unemployed and "can't catch a break."  He has been Inpatient with [facility], 02/2014 for similar presentation. He states he didn't follow up with [facility] for their SAIOP, as instructed upon discharge.  Patient admits to Alcohol and Cocaine use. His UDS was positive for cocaine. Upon arrival to the ER, his BAC was 357.  Patient continues to endorse SI. He denies HI and AV/H. He denies any involvement with the legal system.    Diagnosis: Depression, Alcohol & Cocaine Use Disorder  Past Medical History:  Past Medical History  Diagnosis Date  . CAD (coronary artery disease)     a. 2007 MI in [place], [place] ->Cath reportedly nl.  Marland Kitchen HTN (hypertension)   . Hyperlipidemia   . Borderline diabetes   . Cocaine abuse   . History of ETOH abuse     a. Cut back beginning in 2014.  Marland Kitchen PAF (paroxysmal atrial fibrillation)     a. Dx in 2007->s/p DCCV x 2 (last "a few yrs ago");  b. Never anticoagulated (CHA2DS2VASc = 1).  . Obesity   . Diabetes mellitus without complication     History reviewed. No pertinent past surgical history.  Family History:  Family History  Problem Relation Age of Onset  . Lung cancer Mother     Social History:  reports that he has  never smoked. He does not have any smokeless tobacco history on file. He reports that he drinks alcohol. He reports that he does not use illicit drugs.  Additional Social History:  Alcohol / Drug Use Pain Medications: No abuse reported Prescriptions: No abuse reported Over the Counter: No abuse reported History of alcohol / drug use?: Yes Longest period of sobriety (when/how long): Unknown (Unknown) Negative Consequences of Use: Personal relationships, Financial Substance #1 Name of Substance 1: Alcohol 1 - Age of First Use: 11 1 - Amount (size/oz): "5th of Liquor" 1 - Frequency: Daily 1 - Duration: This amount for approximately a year 1 - Last Use / Amount: "5th of Liquor"   Substance #2 Name of Substance 2: Cocaine 2 - Age of First Use: 21 2 - Amount (size/oz): Unknown-"My friends always bring it." 2 - Frequency: Approximately 2 times a month  2 - Duration: Several years "On and off" 2 - Last Use / Amount: 12/25/2014  CIWA: CIWA-Ar BP: (!) 107/49 mmHg Pulse Rate: 95 Nausea and Vomiting: 2 Tactile Disturbances: none Tremor: two Auditory Disturbances: not present Paroxysmal Sweats: no sweat visible Visual Disturbances: not present Anxiety: no anxiety, at ease Headache, Fullness in Head: none present Agitation: normal activity Orientation and Clouding of Sensorium: oriented and can do serial additions CIWA-Ar Total: 4 COWS:    Allergies: No Known Allergies  Home Medications:  (  Not in a hospital admission)  OB/GYN Status:  No LMP for male patient.  General Assessment Data Location of Assessment: [facility] ED TTS Assessment: In system Is this a Tele or Face-to-Face Assessment?: Face-to-Face Is this an Initial Assessment or a Re-assessment for this encounter?: Initial Assessment Marital status: Single Maiden name: n/a Is patient pregnant?: No Pregnancy Status: No Living Arrangements: Other relatives (Lives with Cousin) Can pt return to current living arrangement?:  Yes Admission Status: Involuntary Is patient capable of signing voluntary admission?: No Referral Source: Self/Family/Friend Insurance type: Unknown  Medical Screening Exam [facility] Walk-in ONLY) Medical Exam completed: Yes  Crisis Care Plan Living Arrangements: Other relatives (Lives with Cousin) Name of Psychiatrist: None Name of Therapist: None  Education Status Is patient currently in school?: No Current Grade: n/a Highest grade of school patient has completed: 12th, Diploma Name of school: n/a Contact person: n/a  Risk to self with the past 6 months Suicidal Ideation: Yes-Currently Present Has patient been a risk to self within the past 6 months prior to admission? : Yes Suicidal Intent: No Has patient had any suicidal intent within the past 6 months prior to admission? : Yes Is patient at risk for suicide?: Yes Suicidal Plan?: No Has patient had any suicidal plan within the past 6 months prior to admission? : Yes Access to Means: No What has been your use of drugs/alcohol within the last 12 months?: Alcohol and Cocaine Previous Attempts/Gestures: No Other Self Harm Risks: None Reported Triggers for Past Attempts: None known Intentional Self Injurious Behavior: None Family Suicide History: Unknown Recent stressful life event(s): Financial Problems, Conflict (Comment), Job Loss Persecutory voices/beliefs?: No Depression: Yes Depression Symptoms: Tearfulness, Isolating, Fatigue, Feeling worthless/self pity Substance abuse history and/or treatment for substance abuse?: Yes Suicide prevention information given to non-admitted patients: Not applicable  Risk to Others within the past 6 months Homicidal Ideation: No Does patient have any lifetime risk of violence toward others beyond the six months prior to admission? : No Thoughts of Harm to Others: No Current Homicidal Intent: No Current Homicidal Plan: No Access to Homicidal Means: No Identified Victim: None  Reported History of harm to others?: No Assessment of Violence: None Noted Violent Behavior Description: None Reported Does patient have access to weapons?: No Criminal Charges Pending?: No Does patient have a court date: No Is patient on probation?: No  Psychosis Hallucinations: None noted Delusions: None noted  Mental Status Report Appearance/Hygiene: In scrubs, In hospital gown Eye Contact: Fair Motor Activity: Freedom of movement, Unremarkable Speech: Logical/coherent, Soft Level of Consciousness: Alert Mood: Depressed, Anxious, Sad Affect: Appropriate to circumstance, Depressed Anxiety Level: Minimal Thought Processes: Coherent, Relevant Judgement: Impaired Orientation: Place, Person, Time, Situation, Appropriate for developmental age Obsessive Compulsive Thoughts/Behaviors: Minimal  Cognitive Functioning Concentration: Normal Memory: Recent Intact, Remote Intact IQ: Average Insight: Poor Impulse Control: Poor Appetite: Fair Weight Loss: 0 Weight Gain: 0 Sleep: No Change Total Hours of Sleep: 6 Vegetative Symptoms: None  ADLScreening [facility] Assessment Services) Patient's cognitive ability adequate to safely complete daily activities?: Yes Patient able to express need for assistance with ADLs?: Yes Independently performs ADLs?: Yes (appropriate for developmental age)  Prior Inpatient Therapy Prior Inpatient Therapy: Yes Prior Therapy Dates: 02/2014 Prior Therapy Facilty/Provider(s): [facility] Reason for Treatment: Depression & Addiction  Prior Outpatient Therapy Prior Outpatient Therapy: No (Didn't follow up with outpatient referral) Prior Therapy Dates: n/a Prior Therapy Facilty/Provider(s): n/a Reason for Treatment: n/a Does patient have an ACCT team?: No Does patient have Intensive In-House Services?  :  No Does patient have [facility] services? : No Does patient have P4CC services?: No  ADL Screening (condition at time of admission) Patient's cognitive  ability adequate to safely complete daily activities?: Yes Patient able to express need for assistance with ADLs?: Yes Independently performs ADLs?: Yes (appropriate for developmental age)       Abuse/Neglect Assessment (Assessment to be complete while patient is alone) Physical Abuse: Denies Verbal Abuse: Denies Sexual Abuse: Denies Exploitation of patient/patient's resources: Denies Self-Neglect: Denies Values / Beliefs Cultural Requests During Hospitalization: None Spiritual Requests During Hospitalization: None Consults Spiritual Care Consult Needed: No Social Work Consult Needed: No Merchant navy officer (For Healthcare) Does patient have an advance directive?: No Would patient like information on creating an advanced directive?: Yes English as a second language teacher given    Additional Information 1:1 In Past 12 Months?: No CIRT Risk: No Elopement Risk: No Does patient have medical clearance?: Yes  Child/Adolescent Assessment Running Away Risk: Denies (Patient is an adult)  Disposition:  Disposition Initial Assessment Completed for this Encounter: Yes Disposition of Patient: Other dispositions (Psych MD to see) Other disposition(s): Other (Comment) (Psych MD to see)  On Site Evaluation by:   Reviewed with Physician:    Lilyan Gilford, MS, LCAS, LPC, NCC, CCSI 12/27/2014 4:23 PM

## 2014-12-27 NOTE — ED Notes (Signed)
ED tech, Kennedy Bucker in triage to change pt into behav scrubs

## 2014-12-27 NOTE — Progress Notes (Signed)
49 year old male admitted under IVC for major depression and ETOH abuse. Received to unit from ED in scrubs looking disheveled with flat, depressed affect.  Speech is soft and low.  Denies SI at this time.  Reports that he has been drinking a 1/5 of liquor daily for past 4 months.  At this time cannot verbalize ins triggers or stressors that has caused him to start drinking.  When asked about his code patient states "I don't need to remember it I will not be having any visitors because I don't want any" Then states "I just want to lay down"  Patient shown to his room. Body search and skin assessment completed upon arrival to unit, no contraband found.

## 2014-12-27 NOTE — ED Notes (Addendum)
Patient ambulatory to triage with steady gait, without difficulty or distress noted, brought in by [facility] PD officer who st he went on disturbance call and pt asked to be brought in; pt with slurred speech, admits to wanting to hurt self; +ETOH  "too dam much"; st attempted to shoot self before; denies specific plan tonight, states "just tired of being here"; st hx cocaine use but has not used in few days

## 2014-12-27 NOTE — ED Notes (Signed)
Dr clapacs in with pt now.  

## 2014-12-27 NOTE — Consult Note (Signed)
[facility] Face-to-Face Psychiatry Consult   Reason for Consult:  Consult for this 49 year old man with a history of recurrent severe depression and alcohol abuse Referring Physician:  Paduchowski Patient Identification: [name] MRN:  [id number] Principal Diagnosis: Severe recurrent major depression without psychotic features Diagnosis:   Patient Active Problem List   Diagnosis Date Noted  . Severe recurrent major depression without psychotic features [F33.2] 12/27/2014  . Alcohol abuse [F10.10] 12/27/2014  . Diabetes [E11.9] 12/27/2014  . Hypertension [I10] 12/27/2014    Total Time spent with patient: 1 hour  Subjective:   [name] is a 49 y.o. male patient admitted with "I just feel like I would be better off if I were dead. I drank way too much".  HPI:  Patient with a history of alcohol abuse and depression comes into the hospital reporting suicidal thoughts. He says his mood is been very sad and depressed for at least a month and is getting worse. He feels down all the time. He doesn't enjoy anything and is negative about the future. His sleep is that he is fatigued and sleepy all the time. Eating sporadically. He is having suicidal thoughts with a plan of walking out in front of traffic. Denies homicidal ideation. He has not had auditory or visual hallucinations. He is not getting any psychiatric treatment currently. He has been drinking very heavily. About a fifth of liquor a day. This is been going on for a couple months and is also getting worse. Admits he is also intermittently abused cocaine. In addition to the obvious substance abuse stress includes having little social support and being out of work and his chronic medical problems.  Past psychiatric history recurrent depression. Years of substance abuse especially alcohol and intermittent cocaine. He says he is never really stopped drinking for any length of time because he's never seriously engaged in treatment. He has not  had any treatment for depression in about a year. Never followed up with outpatient treatment very well. He denies any actual suicide attempts in the past but has had hospitalization.  Substance abuse history: Years of alcohol abuse. Denies known history of seizures from alcohol withdrawal. Unclear whether he may have had DTs in the past. Abuse of cocaine. Doesn't really engage much in appropriate outpatient substance abuse treatment. Gets very shaky and sick to his stomach when he is detoxing.  Medical history: Has diabetes and high blood pressure for both of which she is supposed to be taking oral medicine. No other ongoing medical problems no history of heart attack or stroke.  Family history: Multiple family members with alcohol abuse. No other known history of mental illness in the family.  Medications: He states he takes metformin and metoprolol area doesn't know the correct doses  Past Psychiatric History: Has had previous psychiatric hospitalizations for depression and alcohol abuse. No history of suicide attempts. No history of violence. Doesn't really follow up with outpatient treatment very well.  Risk to Self: Suicidal Ideation: Yes-Currently Present Suicidal Intent: No Is patient at risk for suicide?: Yes Suicidal Plan?: No Access to Means: No What has been your use of drugs/alcohol within the last 12 months?: Alcohol and Cocaine Other Self Harm Risks: None Reported Triggers for Past Attempts: None known Intentional Self Injurious Behavior: None Risk to Others: Homicidal Ideation: No Thoughts of Harm to Others: No Current Homicidal Intent: No Current Homicidal Plan: No Access to Homicidal Means: No Identified Victim: None Reported History of harm to others?: No Assessment of  Violence: None Noted Violent Behavior Description: None Reported Does patient have access to weapons?: No Criminal Charges Pending?: No Does patient have a court date: No Prior Inpatient Therapy:  Prior Inpatient Therapy: Yes Prior Therapy Dates: 02/2014 Prior Therapy Facilty/Provider(s): [facility] Reason for Treatment: Depression & Addiction Prior Outpatient Therapy: Prior Outpatient Therapy: No (Didn't follow up with outpatient referral) Prior Therapy Dates: n/a Prior Therapy Facilty/Provider(s): n/a Reason for Treatment: n/a Does patient have an ACCT team?: No Does patient have Intensive In-House Services?  : No Does patient have [facility] services? : No Does patient have P4CC services?: No  Past Medical History:  Past Medical History  Diagnosis Date  . CAD (coronary artery disease)     a. 2007 MI in [place], [place] ->Cath reportedly nl.  Marland Kitchen HTN (hypertension)   . Hyperlipidemia   . Borderline diabetes   . Cocaine abuse   . History of ETOH abuse     a. Cut back beginning in 2014.  Marland Kitchen PAF (paroxysmal atrial fibrillation)     a. Dx in 2007->s/p DCCV x 2 (last "a few yrs ago");  b. Never anticoagulated (CHA2DS2VASc = 1).  . Obesity   . Diabetes mellitus without complication    History reviewed. No pertinent past surgical history. Family History:  Family History  Problem Relation Age of Onset  . Lung cancer Mother    Family Psychiatric  History: Multiple members of his family with alcohol abuse. No known history of suicide Social History:  History  Alcohol Use  . Yes    Comment: Previously drank heavily.  Cut back in 2014 but still drinks on occassion.     History  Drug Use No    Comment: Habitual crack cocaine usage.    Social History   Social History  . Marital Status: Single    Spouse Name: N/A  . Number of Children: N/A  . Years of Education: N/A   Social History Main Topics  . Smoking status: Never Smoker   . Smokeless tobacco: None  . Alcohol Use: Yes     Comment: Previously drank heavily.  Cut back in 2014 but still drinks on occassion.  . Drug Use: No     Comment: Habitual crack cocaine usage.  Marland Kitchen Sexual Activity: Not Asked   Other Topics Concern  .  None   Social History Narrative   Lives in [place] with his mother.  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   Additional Social History:    Pain Medications: No abuse reported Prescriptions: No abuse reported Over the Counter: No abuse reported History of alcohol / drug use?: Yes Longest period of sobriety (when/how long): Unknown (Unknown) Negative Consequences of Use: Personal relationships, Financial Name of Substance 1: Alcohol 1 - Age of First Use: 11 1 - Amount (size/oz): "5th of Liquor" 1 - Frequency: Daily 1 - Duration: This amount for approximately a year 1 - Last Use / Amount: "5th of Liquor"   Name of Substance 2: Cocaine 2 - Age of First Use: 21 2 - Amount (size/oz): Unknown-"My friends always bring it." 2 - Frequency: Approximately 2 times a month  2 - Duration: Several years "On and off" 2 - Last Use / Amount: 12/25/2014                 Allergies:  No Known Allergies  Labs:  Results for orders placed or performed during the hospital encounter of 12/27/14 (from the past 48  hour(s))  Comprehensive metabolic panel     Status: Abnormal   Collection Time: 12/27/14  2:31 AM  Result Value Ref Range   Sodium 139 135 - 145 mmol/L   Potassium 3.8 3.5 - 5.1 mmol/L   Chloride 105 101 - 111 mmol/L   CO2 22 22 - 32 mmol/L   Glucose, Bld 175 (H) 65 - 99 mg/dL   BUN 5 (L) 6 - 20 mg/dL   Creatinine, Ser 0.87 0.61 - 1.24 mg/dL   Calcium 8.9 8.9 - 10.3 mg/dL   Total Protein 8.4 (H) 6.5 - 8.1 g/dL   Albumin 4.4 3.5 - 5.0 g/dL   AST 26 15 - 41 U/L   ALT 19 17 - 63 U/L   Alkaline Phosphatase 64 38 - 126 U/L   Total Bilirubin 0.6 0.3 - 1.2 mg/dL   GFR calc non Af Amer >60 >60 mL/min   GFR calc Af Amer >60 >60 mL/min    Comment: (NOTE) The eGFR has been calculated using the CKD EPI equation. This calculation has not been validated in all clinical situations. eGFR's persistently <60 mL/min signify possible Chronic  Kidney Disease.    Anion gap 12 5 - 15  Ethanol (ETOH)     Status: Abnormal   Collection Time: 12/27/14  2:31 AM  Result Value Ref Range   Alcohol, Ethyl (B) 357 (HH) <5 mg/dL    Comment: CRITICAL RESULT CALLED TO, READ BACK BY AND VERIFIED WITH DR. Nancy Fetter 12/27/2014 0315 [facility]        LOWEST DETECTABLE LIMIT FOR SERUM ALCOHOL IS 5 mg/dL FOR MEDICAL PURPOSES ONLY   Salicylate level     Status: None   Collection Time: 12/27/14  2:31 AM  Result Value Ref Range   Salicylate Lvl <9.2 2.8 - 30.0 mg/dL  Acetaminophen level     Status: Abnormal   Collection Time: 12/27/14  2:31 AM  Result Value Ref Range   Acetaminophen (Tylenol), Serum <10 (L) 10 - 30 ug/mL    Comment:        THERAPEUTIC CONCENTRATIONS VARY SIGNIFICANTLY. A RANGE OF 10-30 ug/mL MAY BE AN EFFECTIVE CONCENTRATION FOR MANY PATIENTS. HOWEVER, SOME ARE BEST TREATED AT CONCENTRATIONS OUTSIDE THIS RANGE. ACETAMINOPHEN CONCENTRATIONS >150 ug/mL AT 4 HOURS AFTER INGESTION AND >50 ug/mL AT 12 HOURS AFTER INGESTION ARE OFTEN ASSOCIATED WITH TOXIC REACTIONS.   CBC     Status: Abnormal   Collection Time: 12/27/14  2:31 AM  Result Value Ref Range   WBC 8.4 3.8 - 10.6 K/uL   RBC 5.68 4.40 - 5.90 MIL/uL   Hemoglobin 16.5 13.0 - 18.0 g/dL   HCT 49.1 40.0 - 52.0 %   MCV 86.5 80.0 - 100.0 fL   MCH 29.0 26.0 - 34.0 pg   MCHC 33.6 32.0 - 36.0 g/dL   RDW 15.5 (H) 11.5 - 14.5 %   Platelets 279 150 - 440 K/uL  Urine Drug Screen, Qualitative ([facility] only)     Status: Abnormal   Collection Time: 12/27/14  2:31 AM  Result Value Ref Range   Tricyclic, Ur Screen NONE DETECTED NONE DETECTED   Amphetamines, Ur Screen NONE DETECTED NONE DETECTED   MDMA (Ecstasy)Ur Screen NONE DETECTED NONE DETECTED   Cocaine Metabolite,Ur [place] POSITIVE (A) NONE DETECTED   Opiate, Ur Screen NONE DETECTED NONE DETECTED   Phencyclidine (PCP) Ur S NONE DETECTED NONE DETECTED   Cannabinoid 50 Ng, Ur [place] NONE DETECTED NONE DETECTED   Barbiturates, Ur Screen NONE  DETECTED NONE DETECTED  Benzodiazepine, Ur Scrn NONE DETECTED NONE DETECTED   Methadone Scn, Ur NONE DETECTED NONE DETECTED    Comment: (NOTE) 935  Tricyclics, urine               Cutoff 1000 ng/mL 200  Amphetamines, urine             Cutoff 1000 ng/mL 300  MDMA (Ecstasy), urine           Cutoff 500 ng/mL 400  Cocaine Metabolite, urine       Cutoff 300 ng/mL 500  Opiate, urine                   Cutoff 300 ng/mL 600  Phencyclidine (PCP), urine      Cutoff 25 ng/mL 700  Cannabinoid, urine              Cutoff 50 ng/mL 800  Barbiturates, urine             Cutoff 200 ng/mL 900  Benzodiazepine, urine           Cutoff 200 ng/mL 1000 Methadone, urine                Cutoff 300 ng/mL 1100 1200 The urine drug screen provides only a preliminary, unconfirmed 1300 analytical test result and should not be used for non-medical 1400 purposes. Clinical consideration and professional judgment should 1500 be applied to any positive drug screen result due to possible 1600 interfering substances. A more specific alternate chemical method 1700 must be used in order to obtain a confirmed analytical result.  1800 Gas chromato graphy / mass spectrometry (GC/MS) is the preferred 1900 confirmatory method.   Troponin I     Status: None   Collection Time: 12/27/14  2:31 AM  Result Value Ref Range   Troponin I <0.03 <0.031 ng/mL    Comment:        NO INDICATION OF MYOCARDIAL INJURY.   Troponin I     Status: None   Collection Time: 12/27/14  6:41 AM  Result Value Ref Range   Troponin I <0.03 <0.031 ng/mL    Comment:        NO INDICATION OF MYOCARDIAL INJURY.     Current Facility-Administered Medications  Medication Dose Route Frequency Provider Last Rate Last Dose  . citalopram (CELEXA) tablet 20 mg  20 mg Oral Daily Gonzella Lex, MD      . LORazepam (ATIVAN) injection 0-4 mg  0-4 mg Intravenous 4 times per day Orbie Pyo, MD   0 mg at 12/27/14 1248  . LORazepam (ATIVAN) injection 0-4  mg  0-4 mg Intravenous Q12H Orbie Pyo, MD   0 mg at 12/27/14 1247  . LORazepam (ATIVAN) tablet 0-4 mg  0-4 mg Oral 4 times per day Orbie Pyo, MD   0 mg at 12/27/14 1248  . LORazepam (ATIVAN) tablet 0-4 mg  0-4 mg Oral Q12H Orbie Pyo, MD   0 mg at 12/27/14 1248  . metFORMIN (GLUCOPHAGE) tablet 500 mg  500 mg Oral BID WC Gonzella Lex, MD      . metoprolol tartrate (LOPRESSOR) tablet 25 mg  25 mg Oral BID Gonzella Lex, MD      . thiamine (B-1) injection 100 mg  100 mg Intravenous Daily Orbie Pyo, MD   100 mg at 12/27/14 1248  . thiamine (VITAMIN B-1) tablet 100 mg  100 mg Oral Daily Orbie Pyo, MD   100 mg  at 12/27/14 1258   No current outpatient prescriptions on file.    Musculoskeletal: Strength & Muscle Tone: within normal limits Gait & Station: unsteady Patient leans: N/A  Psychiatric Specialty Exam: Review of Systems  Constitutional: Positive for malaise/fatigue.  HENT: Negative.   Eyes: Negative.   Respiratory: Negative.   Cardiovascular: Negative.   Gastrointestinal: Positive for nausea.  Musculoskeletal: Negative.   Skin: Negative.   Neurological: Positive for tremors.  Psychiatric/Behavioral: Positive for depression, suicidal ideas, memory loss and substance abuse. Negative for hallucinations. The patient is nervous/anxious. The patient does not have insomnia.     Blood pressure 107/49, pulse 95, temperature 99 F (37.2 C), temperature source Oral, resp. rate 18, SpO2 96 %.There is no weight on file to calculate BMI.  General Appearance: Fairly Groomed  Engineer, water::  Poor  Speech:  Garbled  Volume:  Decreased  Mood:  Depressed  Affect:  Tearful  Thought Process:  Tangential  Orientation:  Full (Time, Place, and Person)  Thought Content:  Negative  Suicidal Thoughts:  Yes.  with intent/plan  Homicidal Thoughts:  No  Memory:  Immediate;   Fair Recent;   Fair Remote;   Fair  Judgement:  Impaired   Insight:  Shallow  Psychomotor Activity:  Decreased  Concentration:  Fair  Recall:  [place]: Fair  Akathisia:  No  Handed:  Right  AIMS (if indicated):     Assets:  Communication Skills Desire for Improvement Resilience Social Support  ADL's:  Intact  Cognition: WNL  Sleep:      Treatment Plan Summary: Daily contact with patient to assess and evaluate symptoms and progress in treatment, Medication management and Plan Due to presence of suicidal ideatio,, he will be admitted to the psychiatric service. Detox orders in place. Restart metformin and metoprolol both relatively low doses. Initiate Celexa for depression. Supportive counseling completed. Reviewed labs and reviewed current vital signs. Case discussed with emergency room physician and psychiatric staff. Patient will be admitted for treatment of depression with alcohol withdrawal as well.  Disposition: Recommend psychiatric Inpatient admission when medically cleared. Supportive therapy provided about ongoing stressors.  John Clapacs 12/27/2014 4:39 PM

## 2014-12-27 NOTE — Plan of Care (Signed)
Problem: Ineffective individual coping Goal: LTG: Patient will report a decrease in negative feelings Outcome: Not Progressing Endorses depression and presents with flat, depressed affect.

## 2014-12-27 NOTE — ED Notes (Signed)
Pt taken to 19H, escorted by ED tech, Kennedy Bucker

## 2014-12-27 NOTE — ED Notes (Signed)
Pt sleeping   siderails up x 2. 

## 2014-12-27 NOTE — ED Provider Notes (Signed)
[facility] Emergency Department Provider Note  ____________________________________________  Time seen: Approximately 3:02 AM  I have reviewed the triage vital signs and the nursing notes.   HISTORY  Chief Complaint Mental Health Problem    HPI [name] is a 49 y.o. male who presents to the ED brought by police from home for alcohol intoxication, depression with suicidal ideation. Patient has a history of EtOH abuse, MDD with prior suicide attempt.No specific plan; "just tired of being here". Also has a history of cocaine use but states he has not used in a few days. Patient verbalizes no medical complaints. Arrives to the ED intoxicated and tearful.   Past Medical History  Diagnosis Date  . CAD (coronary artery disease)     a. 2007 MI in [place], [place] ->Cath reportedly nl.  Marland Kitchen HTN (hypertension)   . Hyperlipidemia   . Borderline diabetes   . Cocaine abuse   . History of ETOH abuse     a. Cut back beginning in 2014.  Marland Kitchen PAF (paroxysmal atrial fibrillation)     a. Dx in 2007->s/p DCCV x 2 (last "a few yrs ago");  b. Never anticoagulated (CHA2DS2VASc = 1).  . Obesity   . Diabetes mellitus without complication     There are no active problems to display for this patient.   No past surgical history on file.  Current Outpatient Rx  Name  Route  Sig  Dispense  Refill  . furosemide (LASIX) 20 MG tablet   Oral   Take 2 tablets (40 mg total) by mouth 2 (two) times daily.   10 tablet   0     Allergies Review of patient's allergies indicates no known allergies.  Family History  Problem Relation Age of Onset  . Lung cancer Mother     Social History Social History  Substance Use Topics  . Smoking status: Never Smoker   . Smokeless tobacco: Not on file  . Alcohol Use: Yes     Comment: Previously drank heavily.  Cut back in 2014 but still drinks on occassion.    Review of Systems Constitutional: No fever/chills Eyes: No visual  changes. ENT: No sore throat. Cardiovascular: Denies chest pain. Respiratory: Denies shortness of breath. Gastrointestinal: No abdominal pain.  No nausea, no vomiting.  No diarrhea.  No constipation. Genitourinary: Negative for dysuria. Musculoskeletal: Negative for back pain. Skin: Negative for rash. Neurological: Negative for headaches, focal weakness or numbness. Psychiatric:Positive for depression with suicidal ideation.  10-point ROS otherwise negative.  ____________________________________________   PHYSICAL EXAM:  VITAL SIGNS: ED Triage Vitals  Enc Vitals Group     BP 12/27/14 0224 138/94 mmHg     Pulse Rate 12/27/14 0224 111     Resp 12/27/14 0224 18     Temp 12/27/14 0224 99 F (37.2 C)     Temp Source 12/27/14 0224 Oral     SpO2 12/27/14 0224 94 %     Weight --      Height --      Head Cir --      Peak Flow --      Pain Score --      Pain Loc --      Pain Edu? --      Excl. in GC? --     Constitutional: Alert and oriented. Disheveled, tearful and in mild acute distress. Eyes: Conjunctivae are normal. PERRL. EOMI. Head: Atraumatic. Nose: No congestion/rhinnorhea. Mouth/Throat: Mucous membranes are moist.  Oropharynx non-erythematous. Neck:  No stridor.   Cardiovascular: Tachycardiac, regular rhythm. Grossly normal heart sounds.  Good peripheral circulation. Respiratory: Normal respiratory effort.  No retractions. Lungs CTAB. Gastrointestinal: Soft and nontender. No distention. No abdominal bruits. No CVA tenderness. Musculoskeletal: No lower extremity tenderness nor edema.  No joint effusions. Neurologic:  Normal speech and language. No gross focal neurologic deficits are appreciated.  Skin:  Skin is warm, dry and intact. No rash noted. Psychiatric: Mood and affect are normal. Speech and behavior are normal.  ____________________________________________   LABS (all labs ordered are listed, but only abnormal results are displayed)  Labs Reviewed   COMPREHENSIVE METABOLIC PANEL - Abnormal; Notable for the following:    Glucose, Bld 175 (*)    BUN 5 (*)    Total Protein 8.4 (*)    All other components within normal limits  ETHANOL - Abnormal; Notable for the following:    Alcohol, Ethyl (B) 357 (*)    All other components within normal limits  ACETAMINOPHEN LEVEL - Abnormal; Notable for the following:    Acetaminophen (Tylenol), Serum <10 (*)    All other components within normal limits  CBC - Abnormal; Notable for the following:    RDW 15.5 (*)    All other components within normal limits  URINE DRUG SCREEN, QUALITATIVE ([facility] ONLY) - Abnormal; Notable for the following:    Cocaine Metabolite,Ur [place] POSITIVE (*)    All other components within normal limits  SALICYLATE LEVEL  TROPONIN I   ____________________________________________  EKG  ED ECG REPORT I, Mont[place]ius Subramaniam J, the attending physician, personally viewed and interpreted this ECG.   Date: 12/27/2014  EKG Time: 0330  Rate: 95  Rhythm: normal EKG, normal sinus rhythm  Axis: LAD  Intervals:right bundle branch block  ST&T Change: Nonspecific  ____________________________________________  RADIOLOGY  None ____________________________________________   PROCEDURES  Procedure(s) performed: None  Critical Care performed: No  ____________________________________________   INITIAL IMPRESSION / ASSESSMENT AND PLAN / ED COURSE  Pertinent labs & imaging results that were available during my care of the patient were reviewed by me and considered in my medical decision making (see chart for details).  49 year old male with a history of prior suicide attempt who presents by [facility] police department intoxicated with active suicidal ideation. Will place patient under IVC for his safety; consult TTS and psychiatry.  ----------------------------------------- 3:50 AM on 12/27/2014 -----------------------------------------  Patient crying; complained of chest  pain. EKG obtained. Troponin added to lab work. IV fluids initiated. Urine drug screen notable for + cocaine.  ----------------------------------------- 7:13 AM on 12/27/2014 -----------------------------------------  Repeat troponin negative. Patient is resting calmly. 1:1 sitter discontinued. Patient is medically cleared at this time pending psychiatry consult. ____________________________________________   FINAL CLINICAL IMPRESSION(S) / ED DIAGNOSES  Final diagnoses:  Cocaine abuse  Depression  Suicidal ideation      Irean Hong, MD 12/27/14 [phone]

## 2014-12-27 NOTE — ED Notes (Signed)
Resumed care from [facility].  Pt sleeping.  siderails up x 2

## 2014-12-27 NOTE — ED Notes (Signed)
ENVIRONMENTAL ASSESSMENT Potentially harmful objects out of patient reach: YES Personal belongings secured: YES Patient dressed in hospital provided attire only: YES Plastic bags out of patient reach: YES Patient care equipment (cords, cables, call bells, lines, and drains) shortened, removed, or accounted for: YES Equipment and supplies removed from bottom of stretcher: YES Potentially toxic materials out of patient reach: YES Sharps container removed or out of patient reach: YES BEHAVIORAL HEALTH ROUNDING Patient sleeping:NO Patient alert and oriented: YES Behavior appropriate: YES Describe behavior: No inappropriate or unacceptable behaviors noted at this time.  Nutrition and fluids offered: YES Toileting and hygiene offered: YES Sitter present: Behavioral tech rounding every 15 minutes on patient to ensure safety.  Law enforcement present: YES Law enforcement agency: [employer] ([employer]) 

## 2014-12-27 NOTE — B[facility] Counselor (Signed)
Pt. is to be admitted to [facility] by Dr. Toni Amend. Attending Physician will be Dr. Ardyth Harps.  Pt. has been assigned to room 322A, by [facility] Charge Nurse [place].  Intake Paper Work has been signed and placed on pt. chart. ER staff [facility].; Dr. Lenard Lance, ER MD; Amy T. Patient's Nurse & Renee Patient Access) have been made aware of the admission.

## 2014-12-27 NOTE — ED Notes (Signed)
Patient is in a hall bed in constant view of ED staff.  ENVIRONMENTAL ASSESSMENT Potentially harmful objects out of patient reach: YES Personal belongings secured: YES Patient dressed in hospital provided attire only: YES Plastic bags out of patient reach: YES Patient care equipment (cords, cables, call bells, lines, and drains) shortened, removed, or accounted for: YES Equipment and supplies removed from bottom of stretcher: YES Potentially toxic materials out of patient reach: Land removed or out of patient reach: YES  BEHAVIORAL HEALTH ROUNDING Patient sleeping: NO Patient alert and oriented: YES Behavior appropriate: YES Nutrition and fluids offered: YES Toileting and hygiene offered: Programmer, multimedia present: YES Patent examiner present: YES

## 2014-12-27 NOTE — ED Notes (Signed)
Pt resting supine on stretcher in hallway with no distress noted; reports sudden onset mid CP, nonradiating with no accomp symptoms; rates 8/10; st hx MI; EKG performed and MD notified; pt tearful, stating "I'm not a bad person, I just need to sleep for one night, can't sleep, drinks to help"; lab called for troponin add-on; pt asking for ativan

## 2014-12-27 NOTE — ED Notes (Signed)
BEHAVIORAL HEALTH ROUNDING Patient sleeping: Yes.   Patient alert and oriented: yes Behavior appropriate: Yes.  ; If no, describe:  Nutrition and fluids offered: Yes  Toileting and hygiene offered: Yes  Sitter present: yes Law enforcement present: Yes  

## 2014-12-27 NOTE — ED Notes (Signed)
Spoke with Dr. Dolores Frame regarding 1:1 sitter. MD Ok with this being discontinued as patient is much more calm at this point and has been sleeping since arrival.

## 2014-12-27 NOTE — ED Provider Notes (Signed)
-----------------------------------------   4:34 PM on 12/27/2014 -----------------------------------------  Psychiatry has seen the patient, they will be admitting for further treatment and evaluation.  Minna Antis, MD 12/27/14 [phone]

## 2014-12-27 NOTE — ED Notes (Signed)
BEHAVIORAL HEALTH ROUNDING Patient sleeping: Yes.   Patient alert and oriented: not applicable Behavior appropriate: Yes.  ; If no, describe: N/A Nutrition and fluids offered: Patient Asleep Toileting and hygiene offered: Patient Asleep Sitter present: yes Law enforcement present: Yes  ENVIRONMENTAL ASSESSMENT Potentially harmful objects out of patient reach: Yes.   Personal belongings secured: Yes.   Patient dressed in hospital provided attire only: Yes.   Plastic bags out of patient reach: Yes.   Patient care equipment (cords, cables, call bells, lines, and drains) shortened, removed, or accounted for: Yes.   Equipment and supplies removed from bottom of stretcher: Yes.   Potentially toxic materials out of patient reach: Yes.   Sharps container removed or out of patient reach: Yes.   

## 2014-12-28 DIAGNOSIS — F1590 Other stimulant use, unspecified, uncomplicated: Secondary | ICD-10-CM | POA: Diagnosis present

## 2014-12-28 DIAGNOSIS — F332 Major depressive disorder, recurrent severe without psychotic features: Principal | ICD-10-CM

## 2014-12-28 DIAGNOSIS — E785 Hyperlipidemia, unspecified: Secondary | ICD-10-CM

## 2014-12-28 DIAGNOSIS — F10239 Alcohol dependence with withdrawal, unspecified: Secondary | ICD-10-CM

## 2014-12-28 DIAGNOSIS — F10939 Alcohol use, unspecified with withdrawal, unspecified: Secondary | ICD-10-CM

## 2014-12-28 DIAGNOSIS — F1020 Alcohol dependence, uncomplicated: Secondary | ICD-10-CM

## 2014-12-28 LAB — GLUCOSE, CAPILLARY
GLUCOSE-CAPILLARY: 130 mg/dL — AB (ref 65–99)
GLUCOSE-CAPILLARY: 170 mg/dL — AB (ref 65–99)
GLUCOSE-CAPILLARY: 117 mg/dL — AB (ref 65–99)

## 2014-12-28 LAB — TSH: TSH: 3.223 u[IU]/mL (ref 0.350–4.500)

## 2014-12-28 LAB — HEMOGLOBIN A1C: Hgb A1c MFr Bld: 6.9 % — ABNORMAL HIGH (ref 4.0–6.0)

## 2014-12-28 MED ORDER — CHLORDIAZEPOXIDE HCL 25 MG PO CAPS
50.0000 mg | ORAL_CAPSULE | Freq: Three times a day (TID) | ORAL | Status: DC
  Administered 2014-12-28 – 2014-12-30 (×9): 50 mg via ORAL
  Filled 2014-12-28 (×9): qty 2

## 2014-12-28 MED ORDER — LISINOPRIL 5 MG PO TABS
5.0000 mg | ORAL_TABLET | Freq: Every day | ORAL | Status: DC
  Administered 2014-12-28 – 2015-01-01 (×5): 5 mg via ORAL
  Filled 2014-12-28 (×5): qty 1

## 2014-12-28 MED ORDER — INSULIN ASPART 100 UNIT/ML ~~LOC~~ SOLN
0.0000 [IU] | Freq: Three times a day (TID) | SUBCUTANEOUS | Status: DC
Start: 2014-12-28 — End: 2015-01-01
  Administered 2014-12-28: 2 [IU] via SUBCUTANEOUS
  Administered 2014-12-29 (×2): 1 [IU] via SUBCUTANEOUS
  Administered 2014-12-30 (×2): 2 [IU] via SUBCUTANEOUS
  Administered 2014-12-31: 1 [IU] via SUBCUTANEOUS
  Filled 2014-12-28 (×3): qty 1
  Filled 2014-12-28 (×2): qty 2
  Filled 2014-12-28: qty 1

## 2014-12-28 MED ORDER — METFORMIN HCL 500 MG PO TABS
1000.0000 mg | ORAL_TABLET | Freq: Two times a day (BID) | ORAL | Status: DC
  Administered 2014-12-28 – 2015-01-01 (×8): 1000 mg via ORAL
  Filled 2014-12-28 (×8): qty 2

## 2014-12-28 MED ORDER — LORAZEPAM 2 MG PO TABS: 2.0000 mg | ORAL_TABLET | Freq: Three times a day (TID) | ORAL | Status: DC | PRN

## 2014-12-28 MED ORDER — PROMETHAZINE HCL 25 MG PO TABS
25.0000 mg | ORAL_TABLET | Freq: Three times a day (TID) | ORAL | Status: DC | PRN
  Administered 2014-12-29 (×2): 25 mg via ORAL
  Filled 2014-12-28 (×2): qty 1

## 2014-12-28 MED ORDER — DILTIAZEM HCL ER COATED BEADS 120 MG PO CP24
120.0000 mg | ORAL_CAPSULE | Freq: Every day | ORAL | Status: DC
  Administered 2014-12-29 – 2015-01-01 (×4): 120 mg via ORAL
  Filled 2014-12-28 (×4): qty 1

## 2014-12-28 MED ORDER — PROMETHAZINE HCL 25 MG PO TABS
50.0000 mg | ORAL_TABLET | Freq: Once | ORAL | Status: AC
  Administered 2014-12-28: 50 mg via ORAL
  Filled 2014-12-28: qty 2

## 2014-12-28 MED ORDER — GLIPIZIDE 5 MG PO TABS
5.0000 mg | ORAL_TABLET | Freq: Two times a day (BID) | ORAL | Status: DC
  Administered 2014-12-28 – 2015-01-01 (×8): 5 mg via ORAL
  Filled 2014-12-28 (×11): qty 1

## 2014-12-28 MED ORDER — PRAVASTATIN SODIUM 20 MG PO TABS
20.0000 mg | ORAL_TABLET | Freq: Every day | ORAL | Status: DC
  Administered 2014-12-28 – 2014-12-31 (×4): 20 mg via ORAL
  Filled 2014-12-28 (×4): qty 1

## 2014-12-28 MED ORDER — ASPIRIN 81 MG PO CHEW
81.0000 mg | CHEWABLE_TABLET | Freq: Every day | ORAL | Status: DC
  Administered 2014-12-28 – 2015-01-01 (×5): 81 mg via ORAL
  Filled 2014-12-28 (×5): qty 1

## 2014-12-28 NOTE — [facility] Group Notes (Signed)
[facility] Group Notes:  (Nursing/MHT/Case Management/Adjunct)  Date:  12/28/2014  Time:  12:13 PM  Type of Therapy:  Psychoeducational Skills  Participation Level:  Did Not Attend    Sajjad Farber 12/28/2014, 12:13 PM

## 2014-12-28 NOTE — Plan of Care (Signed)
Problem: Alteration in mood & ability to function due to Goal: LTG-Patient demonstrates decreased signs of withdrawal (Patient demonstrates decreased signs of withdrawal to the point the patient is safe to return home and continue treatment in an outpatient setting)  Outcome: Not Progressing Signs of withdrawal symptoms noted.  Patient diaphoretic at times.  Moderate tremors noted, nauseated at times.  No appetite.  Just wants to stay in bed and and sleep.

## 2014-12-28 NOTE — [facility] Suicide Risk Assessment (Signed)
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 Alcohol use disorder, severe, dependence [F10.20] 12/28/2014  . Alcohol withdrawal [F10.239] 12/28/2014  . Dyslipidemia [E78.5] 12/28/2014  . Stimulant use disorder- cocaine [F15.99] 12/28/2014  . Severe recurrent major depression without psychotic features [F33.2] 12/27/2014  . Diabetes [E11.9] 12/27/2014  . Hypertension [I10] 12/27/2014     Continued Clinical Symptoms:  Alcohol Use Disorder Identification Test Final Score (AUDIT): 26 The "Alcohol Use Disorders Identification Test", Guidelines for Use in Primary Care, Second Edition.  [employer] writer [facility]). Score between 0-7:  no or low risk or alcohol related problems. Score between 8-15:  moderate risk of alcohol related problems. Score between 16-19:  high risk of alcohol related problems. Score 20 or above:  warrants further diagnostic evaluation for alcohol dependence and treatment.   CLINICAL FACTORS:   Depression:   Comorbid alcohol abuse/dependence Hopelessness Alcohol/Substance Abuse/Dependencies Previous Psychiatric Diagnoses and Treatments Medical Diagnoses and Treatments/Surgeries  Psychiatric Specialty Exam: Physical Exam  ROS    COGNITIVE FEATURES THAT CONTRIBUTE TO RISK:  None    SUICIDE RISK:   Moderate:  Frequent suicidal ideation with limited intensity, and duration, some specificity in terms of plans, no associated intent, good self-control, limited dysphoria/symptomatology, some risk factors present, and identifiable protective factors, including available and accessible social support.  PLAN OF CARE: Admit to  behavioral health   Medical Decision Making:  Established Problem, Worsening (2)  I certify that inpatient services furnished can reasonably be expected to improve the patient's condition.   [name] 12/28/2014, 11:11 AM

## 2014-12-28 NOTE — H&P (Signed)
Psychiatric Admission Assessment Adult  Patient Identification: [name] MRN:  [id number] Date of Evaluation:  12/28/2014 Chief Complaint:  depression Principal Diagnosis: Severe recurrent major depression without psychotic features Diagnosis:   Patient Active Problem List   Diagnosis Date Noted  . Alcohol use disorder, severe, dependence [F10.20] 12/28/2014  . Alcohol withdrawal [F10.239] 12/28/2014  . Dyslipidemia [E78.5] 12/28/2014  . Stimulant use disorder- cocaine [F15.99] 12/28/2014  . Severe recurrent major depression without psychotic features [F33.2] 12/27/2014  . Diabetes [E11.9] 12/27/2014  . Hypertension [I10] 12/27/2014   History of Present Illness: [name] is a 49 y.o. male who presented to the ED on 7/85 brought by police from home for alcohol intoxication, depression with suicidal ideation. Patient has a history of EtOH abuse, MDD with prior suicide attempt.No specific plan; "just tired of being here". Also has a history of cocaine use but states he has not used in a few days.  Patient called 911, requesting help for his depression and having thoughts of suicide. Patient states, he has no specific plan but has had ongoing thoughts of dying for the last two months. Over the last week, he has had an increase of SI. Patient was unable to give the specific causes of it. He states, "My life just sucks. It's everything. Nothing works out for me."  Patient recently moved in with is cousin, approximately two months ago. Prior to that, he was living in [place], [place], with his girlfriend. They were together for approximately one year. Per his report, they came to a mutual agreement, about ending the relationship. Patient also states, he is currently unemployed and "can't catch a break."  He has been Inpatient with [facility], 02/2014 for similar presentation. He states he didn't follow up with [facility] for their SAIOP, as instructed upon discharge.  Patient admits to Alcohol  and Cocaine use. His UDS was positive for cocaine. Upon arrival to the ER, his BAC was 357.  Per ER psychiatrist: "Patient with a history of alcohol abuse and depression comes into the hospital reporting suicidal thoughts. He says his mood is been very sad and depressed for at least a month and is getting worse. He feels down all the time. He doesn't enjoy anything and is negative about the future. His sleep is that he is fatigued and sleepy all the time. Eating sporadically. He is having suicidal thoughts with a plan of walking out in front of traffic. Denies homicidal ideation. He has not had auditory or visual hallucinations. He is not getting any psychiatric treatment currently. He has been drinking very heavily. About a fifth of liquor a day. This is been going on for a couple months and is also getting worse. Admits he is also intermittently abused cocaine. In addition to the obvious substance abuse stress includes having little social support and being out of work and his chronic medical problems."  Today the patient was interviewed he was vague and superficial. He stated that he felt sick and complaining of feeling nervous, and having nausea and vomiting along with diarrhea. The patient states that everything in his life is not going well. He has been feeling suicidal for about a month. He denies having a suicidal plan. He states that his thoughts were "people will be better off if I'm not around". Patient is states he has been unemployed for about a year he has not been able to find work. 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  Patient is states yesterday he did not wanted to be alive anymore and therefore he called the police and asked them to bring him to the hospital.  Patient states he has been living with his cousin in the [place]. Patient denies HI or auditory or visual hallucinations. He does report depressed mood, anhedonia, insomnia, poor energy  and poor concentration.   Substance abuse history: Years of alcohol abuse. No significant periods of sobriety. Denies known history of seizures from alcohol withdrawal. Patient is states he's been drinking daily about a fifth of alcohol per day. He also has been using crack about 3-4 times a week. He denies the use of any other illicit substances. He denies the use of cigarettes.  Patient reports having at least 3 DWIs in the past.  Denies history of treatment in rehabilitation or detox. He was involved with AA in the past but not currently.   Associated Signs/Symptoms: Depression Symptoms:  depressed mood, feelings of worthlessness/guilt, recurrent thoughts of death,  Total Time spent with patient: 1 hour  Past Psychiatric History: Patient states he was hospitalized in our behavioral health unit back in December 2015 for depression and alcoholism. After discharge the patient failed to follow up. He states he did not have transportation to his appointments. Patient denies history of suicidal attempts. He denies history of self-injurious behaviors. Risk to Self: Is patient at risk for suicide?: No Risk to Others:   no Prior Inpatient Therapy:   yes Prior Outpatient Therapy:   no  Alcohol Screening: 1. How often do you have a drink containing alcohol?: 4 or more times a week 2. How many drinks containing alcohol do you have on a typical day when you are drinking?: 10 or more 3. How often do you have six or more drinks on one occasion?: Daily or almost daily Preliminary Score: 8 4. How often during the last year have you found that you were not able to stop drinking once you had started?: Daily or almost daily 5. How often during the last year have you failed to do what was normally expected from you becasue of drinking?: Less than monthly 6. How often during the last year have you needed a first drink in the morning to get yourself going after a heavy drinking session?: Less than monthly 7.  How often during the last year have you had a feeling of guilt of remorse after drinking?: Weekly 8. How often during the last year have you been unable to remember what happened the night before because you had been drinking?: Less than monthly 9. Have you or someone else been injured as a result of your drinking?: No 10. Has a relative or friend or a doctor or another health worker been concerned about your drinking or suggested you cut down?: Yes, during the last year Alcohol Use Disorder Identification Test Final Score (AUDIT): 26 Brief Intervention: Yes   Past Medical History: In December 2015 the patient was admitted to the medical floor for cocaine induced chest pain. He was discharged on the following medications: Lisinopril 5 mg daily, glipizide 5 mg b.i.d., gabapentin 100 mg t.i.d., metformin 1000 mg b.i.d., aspirin 81 mg daily, Cardizem CD 120 mg daily, lovastatin 20 mg daily, allopurinol 100 mg daily, colchicine 0.6 mg 1 capsule b.i.d.  per admission record the patient was not taking any of those medications at admission.  Patient states he has not been compliant with any of his treatments for several months because he did  not have transportation. [facility]  Past Medical History  Diagnosis Date  . CAD (coronary artery disease)     a. 2007 MI in [place], [place] ->Cath reportedly nl.  Marland Kitchen HTN (hypertension)   . Hyperlipidemia   . Borderline diabetes   . Cocaine abuse   . History of ETOH abuse     a. Cut back beginning in 2014.  Marland Kitchen PAF (paroxysmal atrial fibrillation)     a. Dx in 2007->s/p DCCV x 2 (last "a few yrs ago");  b. Never anticoagulated (CHA2DS2VASc = 1).  . Obesity   . Diabetes mellitus without complication    History reviewed. No pertinent past surgical history.  Family History:  Family History  Problem Relation Age of Onset  . Lung cancer Mother    Family Psychiatric  History: Patient reports that his father,  Brother and uncles all suffer from alcoholism. There is no history of mental illness in his family. There is no history of suicidal attempts in his family  Social History: Patient is currently divorced. He has 2 children ages 71 and 7 states he gets along with them. He has been living with his cousin [place] He has been unemployed for about 1 year. Supports himself by picking up at jobs. He has high school education. In the past he worked in Information systems manager.  He states he has been looking for a job for about 1 year with no luck. Denies having any current legal charges but in the past he was charged with 3 DWIs. History  Alcohol Use  . Yes    Comment: Previously drank heavily.  Cut back in 2014 but still drinks on occassion.     History  Drug Use  . 5.00 per week  . Special: "Crack" cocaine    Comment: Habitual crack cocaine usage.    Social History   Social History  . Marital Status: Single    Spouse Name: N/A  . Number of Children: N/A  . Years of Education: N/A   Social History Main Topics  . Smoking status: Never Smoker   . Smokeless tobacco: Never Used  . Alcohol Use: Yes     Comment: Previously drank heavily.  Cut back in 2014 but still drinks on occassion.  . Drug Use: 5.00 per week    Special: "Crack" cocaine     Comment: Habitual crack cocaine usage.  Marland Kitchen Sexual Activity: No   Other Topics Concern  . None   Social History Narrative   Lives in [place] with his mother.  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   Allergies:  No Known Allergies  Lab Results:  Results for orders placed or performed during the hospital encounter of 12/27/14 (from the past 48 hour(s))  Comprehensive metabolic panel     Status: Abnormal   Collection Time: 12/27/14  2:31 AM  Result Value Ref Range   Sodium 139 135 - 145 mmol/L   Potassium 3.8 3.5 - 5.1 mmol/L   Chloride 105 101 - 111 mmol/L   CO2 22 22 - 32 mmol/L   Glucose, Bld 175  (H) 65 - 99 mg/dL   BUN 5 (L) 6 - 20 mg/dL   Creatinine, Ser 0.87 0.61 - 1.24 mg/dL   Calcium 8.9 8.9 - 10.3 mg/dL   Total Protein 8.4 (H) 6.5 - 8.1 g/dL   Albumin 4.4 3.5 - 5.0  g/dL   AST 26 15 - 41 U/L   ALT 19 17 - 63 U/L   Alkaline Phosphatase 64 38 - 126 U/L   Total Bilirubin 0.6 0.3 - 1.2 mg/dL   GFR calc non Af Amer >60 >60 mL/min   GFR calc Af Amer >60 >60 mL/min    Comment: (NOTE) The eGFR has been calculated using the CKD EPI equation. This calculation has not been validated in all clinical situations. eGFR's persistently <60 mL/min signify possible Chronic Kidney Disease.    Anion gap 12 5 - 15  Ethanol (ETOH)     Status: Abnormal   Collection Time: 12/27/14  2:31 AM  Result Value Ref Range   Alcohol, Ethyl (B) 357 (HH) <5 mg/dL    Comment: CRITICAL RESULT CALLED TO, READ BACK BY AND VERIFIED WITH DR. Nancy Fetter 12/27/2014 0315 [facility]        LOWEST DETECTABLE LIMIT FOR SERUM ALCOHOL IS 5 mg/dL FOR MEDICAL PURPOSES ONLY   Salicylate level     Status: None   Collection Time: 12/27/14  2:31 AM  Result Value Ref Range   Salicylate Lvl <5.6 2.8 - 30.0 mg/dL  Acetaminophen level     Status: Abnormal   Collection Time: 12/27/14  2:31 AM  Result Value Ref Range   Acetaminophen (Tylenol), Serum <10 (L) 10 - 30 ug/mL    Comment:        THERAPEUTIC CONCENTRATIONS VARY SIGNIFICANTLY. A RANGE OF 10-30 ug/mL MAY BE AN EFFECTIVE CONCENTRATION FOR MANY PATIENTS. HOWEVER, SOME ARE BEST TREATED AT CONCENTRATIONS OUTSIDE THIS RANGE. ACETAMINOPHEN CONCENTRATIONS >150 ug/mL AT 4 HOURS AFTER INGESTION AND >50 ug/mL AT 12 HOURS AFTER INGESTION ARE OFTEN ASSOCIATED WITH TOXIC REACTIONS.   CBC     Status: Abnormal   Collection Time: 12/27/14  2:31 AM  Result Value Ref Range   WBC 8.4 3.8 - 10.6 K/uL   RBC 5.68 4.40 - 5.90 MIL/uL   Hemoglobin 16.5 13.0 - 18.0 g/dL   HCT 49.1 40.0 - 52.0 %   MCV 86.5 80.0 - 100.0 fL   MCH 29.0 26.0 - 34.0 pg   MCHC 33.6 32.0 - 36.0 g/dL   RDW 15.5  (H) 11.5 - 14.5 %   Platelets 279 150 - 440 K/uL  Urine Drug Screen, Qualitative ([facility] only)     Status: Abnormal   Collection Time: 12/27/14  2:31 AM  Result Value Ref Range   Tricyclic, Ur Screen NONE DETECTED NONE DETECTED   Amphetamines, Ur Screen NONE DETECTED NONE DETECTED   MDMA (Ecstasy)Ur Screen NONE DETECTED NONE DETECTED   Cocaine Metabolite,Ur [place] POSITIVE (A) NONE DETECTED   Opiate, Ur Screen NONE DETECTED NONE DETECTED   Phencyclidine (PCP) Ur S NONE DETECTED NONE DETECTED   Cannabinoid 50 Ng, Ur [place] NONE DETECTED NONE DETECTED   Barbiturates, Ur Screen NONE DETECTED NONE DETECTED   Benzodiazepine, Ur Scrn NONE DETECTED NONE DETECTED   Methadone Scn, Ur NONE DETECTED NONE DETECTED    Comment: (NOTE) 433  Tricyclics, urine               Cutoff 1000 ng/mL 200  Amphetamines, urine             Cutoff 1000 ng/mL 300  MDMA (Ecstasy), urine           Cutoff 500 ng/mL 400  Cocaine Metabolite, urine       Cutoff 300 ng/mL 500  Opiate, urine  Cutoff 300 ng/mL 600  Phencyclidine (PCP), urine      Cutoff 25 ng/mL 700  Cannabinoid, urine              Cutoff 50 ng/mL 800  Barbiturates, urine             Cutoff 200 ng/mL 900  Benzodiazepine, urine           Cutoff 200 ng/mL 1000 Methadone, urine                Cutoff 300 ng/mL 1100 1200 The urine drug screen provides only a preliminary, unconfirmed 1300 analytical test result and should not be used for non-medical 1400 purposes. Clinical consideration and professional judgment should 1500 be applied to any positive drug screen result due to possible 1600 interfering substances. A more specific alternate chemical method 1700 must be used in order to obtain a confirmed analytical result.  1800 Gas chromato graphy / mass spectrometry (GC/MS) is the preferred 1900 confirmatory method.   Troponin I     Status: None   Collection Time: 12/27/14  2:31 AM  Result Value Ref Range   Troponin I <0.03 <0.031 ng/mL     Comment:        NO INDICATION OF MYOCARDIAL INJURY.   Troponin I     Status: None   Collection Time: 12/27/14  6:41 AM  Result Value Ref Range   Troponin I <0.03 <0.031 ng/mL    Comment:        NO INDICATION OF MYOCARDIAL INJURY.     Metabolic Disorder Labs:  Lab Results  Component Value Date   HGBA1C 11.2* 01/06/2014   No results found for: PROLACTIN Lab Results  Component Value Date   CHOL 171 01/06/2014   TRIG 130 01/06/2014   HDL 30* 01/06/2014   VLDL 26 01/06/2014   LDLCALC 115* 01/06/2014   LDLCALC 156* 01/05/2014    Current Medications: Current Facility-Administered Medications  Medication Dose Route Frequency Provider Last Rate Last Dose  . acetaminophen (TYLENOL) tablet 650 mg  650 mg Oral Q6H PRN Gonzella Lex, MD   650 mg at 12/27/14 2230  . alum & mag hydroxide-simeth (MAALOX/MYLANTA) 200-200-20 MG/5ML suspension 30 mL  30 mL Oral Q4H PRN Gonzella Lex, MD      . chlordiazePOXIDE (LIBRIUM) capsule 50 mg  50 mg Oral TID Hildred Priest, MD      . citalopram (CELEXA) tablet 20 mg  20 mg Oral Daily Gonzella Lex, MD      . LORazepam (ATIVAN) tablet 2 mg  2 mg Oral TID PRN Hildred Priest, MD      . magnesium hydroxide (MILK OF MAGNESIA) suspension 30 mL  30 mL Oral Daily PRN Gonzella Lex, MD      . metFORMIN (GLUCOPHAGE) tablet 500 mg  500 mg Oral BID WC Gonzella Lex, MD      . metoprolol tartrate (LOPRESSOR) tablet 25 mg  25 mg Oral BID Gonzella Lex, MD   25 mg at 12/27/14 2230  . multivitamin with minerals tablet 1 tablet  1 tablet Oral Daily Marjie Skiff, MD       PTA Medications: No prescriptions prior to admission    Musculoskeletal: Strength & Muscle Tone: within normal limits Gait & Station: normal Patient leans: N/A  Psychiatric Specialty Exam: Physical Exam  Constitutional: He is oriented to person, place, and time. He appears well-developed and well-nourished.  HENT:  Head: Normocephalic and atraumatic.  Eyes: Conjunctivae and EOM are normal.  Neck: Normal range of motion.  Respiratory: Effort normal.  Musculoskeletal: Normal range of motion.  Neurological: He is alert and oriented to person, place, and time.    Review of Systems  Constitutional: Negative.   HENT: Negative.   Eyes: Negative.   Respiratory: Negative.   Cardiovascular: Negative.   Gastrointestinal: Negative.   Genitourinary: Negative.   Musculoskeletal: Negative.   Skin: Negative.   Neurological: Negative.   Endo/Heme/Allergies: Negative.   Psychiatric/Behavioral: Positive for depression and substance abuse.    Blood pressure 131/92, pulse 92, temperature 98.6 F (37 C), temperature source Oral, resp. rate 18, height 6' (1.829 m), weight 109.77 kg (242 lb), SpO2 94 %.Body mass index is 32.81 kg/(m^2).  General Appearance: Disheveled  Eye Contact::  Minimal  Speech:  Slow  Volume:  Decreased  Mood:  Anxious and Dysphoric  Affect:  Blunt  Thought Process:  Logical  Orientation:  Full (Time, Place, and Person)  Thought Content:  Hallucinations: None  Suicidal Thoughts:  Yes.  without intent/plan  Homicidal Thoughts:  No  Memory:  Immediate;   Good Recent;   Good Remote;   Good  Judgement:  Fair  Insight:  Fair  Psychomotor Activity:  Decreased  Concentration:  NA  Recall:  NA  Fund of Knowledge:Good  Language: Good  Akathisia:  No  Handed:    AIMS (if indicated):     Assets:  Communication Skills Housing Talents/Skills  ADL's:  Intact  Cognition: WNL  Sleep:  Number of Hours: 8   Constitutional: Alert and oriented. Disheveled, tearful and in mild acute distress. Eyes: Conjunctivae are normal. PERRL. EOMI. Head: Atraumatic. Nose: No congestion/rhinnorhea. Mouth/Throat: Mucous membranes are moist. Oropharynx non-erythematous. Neck: No stridor.  Cardiovascular: Tachycardiac, regular rhythm. Grossly normal heart sounds. Good peripheral circulation. Respiratory: Normal respiratory effort. No  retractions. Lungs CTAB. Gastrointestinal: Soft and nontender. No distention. No abdominal bruits. No CVA tenderness. Musculoskeletal: No lower extremity tenderness nor edema. No joint effusions. Neurologic: Normal speech and language. No gross focal neurologic deficits are appreciated.  Skin: Skin is warm, dry and intact. No rash noted. Psychiatric: Mood and affect are normal. Speech and behavior are normal.  Treatment Plan Summary: Daily contact with patient to assess and evaluate symptoms and progress in treatment and Medication management   49 year old divorced Caucasian male with history of alcohol use disorder severe cocaine use disorder severe and depression presented to our emergency department on September 29 voicing suicidal ideation. At presentation his alcohol level was > 300 and urine toxicology was positive for cocaine.  Patient was hospitalized in our unit back in December under similar circumstances. The patient failed to follow-up after discharge. The patient has history of hypertension, diabetes and cardiovascular disease and he also has not been compliant with his treatment for this disorders.  Major depressive disorder patient has been started on Celexa 20 mg by mouth daily  Insomnia I will start the patient on trazodone 100 mg by mouth daily at bedtime when necessary  Alcohol withdrawal patient will be started on Librium 50 mg by mouth 3 times a day and Ativan 2 mg by mouth every 8 hours when necessary CIWA score greater than 15.  Vital signs will be check every 8 hours along with CIWA.    Diabetes: Patient will be reassessed started on metformin at $RemoveBefo'1000mg'[id number]$  by mouth twice a day and glipizide 5 mg by mouth twice a day. Capillary blood sugar will be checked before every meal. Supplemental  insulin has been in order.  We'll check hemoglobin A1c  Hypertension: Patient has been reassessed started on lisinopril 5 mg by mouth twice a day  A. Fib: Continue Cardizem 120 mg by  mouth daily  Dyslipidemia: Patient will be continued on statins.  Nausea: Patient has been started on Phenergan 25 mg by mouth every 8 hours. I will order one dose of Phenergan at 50 mg now.  Labs: I will order TSH hemoglobin A1c  Discharge planning: Patient is interested in treatment for substance abuse inpatient after discharge. I will discuss this with social worker and we will consider a referral to either [facility] or a long-term residential.    I certify that inpatient services furnished can reasonably be expected to improve the patient's condition.   Hildred Priest 9/30/201610:23 AM

## 2014-12-28 NOTE — Plan of Care (Signed)
Problem: Ineffective individual coping Goal: LTG: Patient will report a decrease in negative feelings Outcome: Progressing Patient denies SI/HI. Goal: STG: Patient will remain free from self harm Outcome: Progressing 15 minutes checks maintained.

## 2014-12-28 NOTE — Progress Notes (Signed)
D: Pt denies SI/HI/AVH. Pt is pleasant and cooperative. Patient's affect is flat , sad and depressed, pt on  CIWA; at one time during assessment patient appeared diaphoretic, tired and restless, '"he stated he was tired and wanted to lay down "  He was medicated and also given some oral fluids.  Pt stayed in his room the entire evening he did not interact with peers.  A: Pt was offered support and encouragement. Pt was given scheduled medications. Pt was encouraged to attend groups. Q 15 minute checks were done for safety.  R:Pt did not attends evening group.  Pt is compliant with medication. Pt receptive to treatment and safety maintained on unit.

## 2014-12-28 NOTE — Progress Notes (Signed)
Patient has remained in his room in the bed.  No appetite.  Verbalized that he does not feel like he can eat.  Medication compliant.  15 minute rounding maintained for patients safety.   Gatorade encouraged for hydration.

## 2014-12-28 NOTE — [facility] Group Notes (Signed)
[facility] LCSW Group Therapy  12/28/2014 3:19 PM  Type of Therapy:  Group Therapy  Participation Level:  Did Not Attend   Lulu Riding, MSW, LCSWA 12/28/2014, 3:19 PM

## 2014-12-28 NOTE — Tx Team (Signed)
Initial Interdisciplinary Treatment Plan   PATIENT STRESSORS: Financial difficulties Health problems Substance abuse   PATIENT STRENGTHS: Ability for insight Motivation for treatment/growth   PROBLEM LIST: Problem List/Patient Goals Date to be addressed Date deferred Reason deferred Estimated date of resolution  Depression 12/27/14           Substance abuse 12/27/14           Suicidal ideation.  12/27/14                              DISCHARGE CRITERIA:  Improved stabilization in mood, thinking, and/or behavior Motivation to continue treatment in a less acute level of care Verbal commitment to aftercare and medication compliance  PRELIMINARY DISCHARGE PLAN: Attend 12-step recovery group Outpatient therapy  PATIENT/FAMIILY INVOLVEMENT: This treatment plan has been presented to and reviewed with the patient, [name], The patient and family have been given the opportunity to ask questions and make suggestions.  Bertha Earwood Abisola Maat Kafer 12/28/2014, 5:23 AM

## 2014-12-28 NOTE — [facility] Group Notes (Signed)
[facility] LCSW Group Therapy  12/28/2014 12:21 PM  Type of Therapy:  Group Therapy  Participation Level:  Did not attend.   Modes of Intervention:  Discussion, Education, Socialization and Support  Summary of Progress/Problems: Feelings around Relapse. Group members discussed the meaning of relapse and shared personal stories of relapse, how it affected them and others, and how they perceived themselves during this time. Group members were encouraged to identify triggers, warning signs and coping skills used when facing the possibility of relapse. Social supports were discussed and explored in detail.   Candace L Hyatt MSW, LCSWA  12/28/2014, 12:21 PM  

## 2014-12-29 ENCOUNTER — Encounter: Payer: Self-pay | Admitting: Psychiatry

## 2014-12-29 LAB — GLUCOSE, CAPILLARY
GLUCOSE-CAPILLARY: 144 mg/dL — AB (ref 65–99)
GLUCOSE-CAPILLARY: 136 mg/dL — AB (ref 65–99)
Glucose-Capillary: 88 mg/dL (ref 65–99)

## 2014-12-29 NOTE — [facility] Group Notes (Signed)
[facility] LCSW Group Therapy  12/29/2014 4:26 PM  Type of Therapy:  Group Therapy  Participation Level:  Did Not Attend   Ned Card, LCSW 12/29/2014, 4:26 PM

## 2014-12-29 NOTE — Progress Notes (Signed)
D: Pt denies SI/HI/AVH. Pt is pleasant and cooperative, affect is flat and sad on CIWA protocol, no tremors noted, patient is able to tolerate drinks and even had a cold sandwich tray this evening.  Patient still isolates to the room, Pt stated he feels much better today,  he appears less anxious and he is interacting with staff appropriately.  A: Pt was offered support and encouragement. Pt was given scheduled medications. Pt was encouraged to attend groups. Q 15 minute checks were done for safety.  R:Pt did not attend evening group. Pt is taking medication. Pt has no complaints.Pt receptive to treatment and safety maintained on unit.

## 2014-12-29 NOTE — Plan of Care (Signed)
Problem: Alteration in mood Goal: LTG-Patient reports reduction in suicidal thoughts (Patient reports reduction in suicidal thoughts and is able to verbalize a safety plan for whenever patient is feeling suicidal)  Outcome: Progressing Patient denies SI/HI.      

## 2014-12-29 NOTE — Progress Notes (Signed)
[facility] MD Progress Note  12/29/2014 1:08 PM [name]  MRN:  [id number]  Subjective:    The patient has remained fairly athletic to his room this morning as he has experienced some withdrawal symptoms. The patient says he was drinking close to a fifth of liquor per day. He says he drinks secondary to hopelessness and feeling like he does not have a lot to live for. He was having some passive suicidal thoughts when he came in but he denies any current active or passive suicidal thoughts. The patient admits to also using cocaine intermittently prior to admission. He has been feeling nauseated and having tremors this morning. He is currently on a Librium taper. He denies any auditory or visual hallucinations. No seizure activity. No DTs. The patient was fully alert and oriented to time, place, and situation. He has been eating most of his meals despite nausea. No vomiting. He slept 7 hours last night. Vital signs are stable.  Past psychiatric history: The patient reports years of polysubstance abuse including alcohol dependence and intermittent cocaine use. He has never engaged in any active treatment for substance use. He has not had any treatment for depression in the past and denies any prior suicide attempts. He does report a prior inpatient psychiatric hospitalization for detox. He denies any history of any prior DTs.  Substance abuse history:  The patient does report a history of heavy alcohol use in the past and intermittent cocaine use. He denies any opiate dependence or cannabis use in the past. No history of any DTs.  Family psychiatric history: The patient reports that his dad, brother and sister are all alcoholics and he has a paternal uncle who was an alcoholic   Principal Problem: Severe recurrent major depression without psychotic features (HCC) Diagnosis:   Patient Active Problem List   Diagnosis Date Noted  . Alcohol use disorder, severe, dependence [F10.20] 12/28/2014  .  Alcohol withdrawal [F10.239] 12/28/2014  . Dyslipidemia [E78.5] 12/28/2014  . Stimulant use disorder- cocaine [F15.90] 12/28/2014  . Severe recurrent major depression without psychotic features [F33.2] 12/27/2014  . Diabetes [E11.9] 12/27/2014  . Hypertension [I10] 12/27/2014   Total Time spent with patient: 30 minutes  Past Medical History:  Past Medical History  Diagnosis Date  . CAD (coronary artery disease)     a. 2007 MI in [place], [place] ->Cath reportedly nl.  Marland Kitchen HTN (hypertension)   . Hyperlipidemia   . Borderline diabetes   . Cocaine abuse   . History of ETOH abuse     a. Cut back beginning in 2014.  Marland Kitchen PAF (paroxysmal atrial fibrillation) (HCC)     a. Dx in 2007->s/p DCCV x 2 (last "a few yrs ago");  b. Never anticoagulated (CHA2DS2VASc = 1).  . Obesity   . Diabetes mellitus without complication (HCC)    History reviewed. No pertinent past surgical history. Family History:  Family History  Problem Relation Age of Onset  . Lung cancer Mother     Social History:  History  Alcohol Use  . Yes    Comment: Previously drank heavily.  Cut back in 2014 but still drinks on occassion.     History  Drug Use  . 5.00 per week  . Special: "Crack" cocaine    Comment: Habitual crack cocaine usage.    Social History   Social History  . Marital Status: Single    Spouse Name: N/A  . Number of Children: N/A  . Years of Education: N/A  Social History Main Topics  . Smoking status: Never Smoker   . Smokeless tobacco: Never Used  . Alcohol Use: Yes     Comment: Previously drank heavily.  Cut back in 2014 but still drinks on occassion.  . Drug Use: 5.00 per week    Special: "Crack" cocaine     Comment: Habitual crack cocaine usage.  Marland Kitchen Sexual Activity: No   Other Topics Concern  . None   Social History Narrative   The patient says his dad is deceased but his mother still living. 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 He  has filed for disability. He is currently divorced and has 2 children. He is living with his cousin.      He does report multiple legal charges related to DUI. He was also driving without a valid driver's license more recently and cannot drive.     =        Sleep: Good  Appetite:  Good  Current Medications: Current Facility-Administered Medications  Medication Dose Route Frequency Provider Last Rate Last Dose  . acetaminophen (TYLENOL) tablet 650 mg  650 mg Oral Q6H PRN Audery Amel, MD   650 mg at 12/27/14 2230  . alum & mag hydroxide-simeth (MAALOX/MYLANTA) 200-200-20 MG/5ML suspension 30 mL  30 mL Oral Q4H PRN Audery Amel, MD      . aspirin chewable tablet 81 mg  81 mg Oral Daily Jimmy Footman, MD   81 mg at 12/29/14 0951  . chlordiazePOXIDE (LIBRIUM) capsule 50 mg  50 mg Oral TID Jimmy Footman, MD   50 mg at 12/29/14 0951  . citalopram (CELEXA) tablet 20 mg  20 mg Oral Daily Audery Amel, MD   20 mg at 12/29/14 0950  . diltiazem (CARDIZEM CD) 24 hr capsule 120 mg  120 mg Oral Daily Jimmy Footman, MD   120 mg at 12/29/14 0951  . glipiZIDE (GLUCOTROL) tablet 5 mg  5 mg Oral BID AC Jimmy Footman, MD   5 mg at 12/29/14 0810  . insulin aspart (novoLOG) injection 0-9 Units  0-9 Units Subcutaneous TID WC Jimmy Footman, MD   1 Units at 12/29/14 0809  . lisinopril (PRINIVIL,ZESTRIL) tablet 5 mg  5 mg Oral Daily Jimmy Footman, MD   5 mg at 12/29/14 0951  . LORazepam (ATIVAN) tablet 2 mg  2 mg Oral TID PRN Jimmy Footman, MD      . magnesium hydroxide (MILK OF MAGNESIA) suspension 30 mL  30 mL Oral Daily PRN Audery Amel, MD      . metFORMIN (GLUCOPHAGE) tablet 1,000 mg  1,000 mg Oral BID WC Jimmy Footman, MD   1,000 mg at 12/29/14 0810  . multivitamin with minerals tablet 1 tablet  1 tablet Oral Daily Kerin Salen, MD   1 tablet at 12/29/14 [phone]  . pravastatin (PRAVACHOL) tablet 20 mg  20  mg Oral q1800 Jimmy Footman, MD   20 mg at 12/28/14 1705  . promethazine (PHENERGAN) tablet 25 mg  25 mg Oral Q8H PRN Jimmy Footman, MD   25 mg at 12/29/14 1005    Lab Results:  Results for orders placed or performed during the hospital encounter of 12/27/14 (from the past 48 hour(s))  Glucose, capillary     Status: Abnormal   Collection Time: 12/28/14 10:42 AM  Result Value Ref Range   Glucose-Capillary 130 (H) 65 - 99 mg/dL  Hemoglobin M5H  Status: Abnormal   Collection Time: 12/28/14 10:55 AM  Result Value Ref Range   Hgb A1c MFr Bld 6.9 (H) 4.0 - 6.0 %  TSH     Status: None   Collection Time: 12/28/14 10:55 AM  Result Value Ref Range   TSH 3.223 0.350 - 4.500 uIU/mL  Glucose, capillary     Status: Abnormal   Collection Time: 12/28/14 11:51 AM  Result Value Ref Range   Glucose-Capillary 170 (H) 65 - 99 mg/dL   Comment 1 Notify RN   Glucose, capillary     Status: Abnormal   Collection Time: 12/28/14  5:09 PM  Result Value Ref Range   Glucose-Capillary 117 (H) 65 - 99 mg/dL  Glucose, capillary     Status: Abnormal   Collection Time: 12/29/14  6:48 AM  Result Value Ref Range   Glucose-Capillary 144 (H) 65 - 99 mg/dL  Glucose, capillary     Status: None   Collection Time: 12/29/14 11:53 AM  Result Value Ref Range   Glucose-Capillary 88 65 - 99 mg/dL    Physical Findings: AIMS:  , ,  ,  ,    CIWA:  CIWA-Ar Total: 9 COWS:     Musculoskeletal: Strength & Muscle Tone: within normal limits Gait & Station: unsteady Patient leans: N/A  Psychiatric Specialty Exam: Review of Systems  Constitutional: Positive for malaise/fatigue and diaphoresis. Negative for fever, chills and weight loss.  HENT: Negative for congestion, ear discharge, ear pain, hearing loss, nosebleeds, sore throat and tinnitus.   Eyes: Negative for blurred vision, double vision, photophobia and pain.  Respiratory: Negative.  Negative for cough, hemoptysis, shortness of breath and  wheezing.   Cardiovascular: Negative for chest pain, palpitations and orthopnea.  Gastrointestinal: Positive for nausea. Negative for heartburn, vomiting, abdominal pain, diarrhea and constipation.  Skin: Negative.  Negative for itching and rash.  Neurological: Positive for dizziness and weakness. Negative for tingling, tremors, sensory change, focal weakness, seizures and headaches.  Endo/Heme/Allergies: Negative.  Does not bruise/bleed easily.    Blood pressure 118/86, pulse 104, temperature 98.2 F (36.8 C), temperature source Oral, resp. rate 20, height 6' (1.829 m), weight 109.77 kg (242 lb), SpO2 94 %.Body mass index is 32.81 kg/(m^2).  General Appearance: Disheveled  Eye Contact::  Good  Speech:  Clear and Coherent  Volume:  Normal  Mood:  Depressed  Affect:  Depressed  Thought Process:  Goal Directed and Logical  Orientation:  Full (Time, Place, and Person)  Thought Content:  Negative  Suicidal Thoughts:  No  Homicidal Thoughts:  No  Memory:  Immediate;   Good Recent;   Good Remote;   Good  Judgement:  Good  Insight:  Lacking with regards to the need for substance abuse treatment  Psychomotor Activity:  Decreased  Concentration:  Fair  Recall:  [employer] of Knowledge:Good  Language: Good  Akathisia:  No  Handed:  Right  AIMS (if indicated):     Assets:  Communication Skills Housing Social Support  ADL's:  Intact  Cognition: WNL  Sleep:  Number of Hours: 7   Treatment Plan Summary:    Diagnosis:  Major depressive disorder, recurrent, severe without psychotic features Alcohol use disorder, severe Cocaine use disorder, severe Diabetes Hypertension   Mr. Dorton is a 49 year-old divorced Caucasian male with history of alcohol use disorder severe cocaine use disorder severe and depression presented to our emergency department on September 29 voicing suicidal ideation. At presentation his alcohol level was > 300 and  urine toxicology was positive for cocaine.  Patient was hospitalized in our unit back in December under similar circumstances. The patient failed to follow-up after discharge. The patient has history of hypertension, diabetes and cardiovascular disease and he also has not been compliant with his treatment for this disorders.  Major depressive disorder: The  patient has been started on Celexa 20 mg by mouth daily. No current suicidal thoughts  Insomnia: He was started on  trazodone 100 mg by mouth daily at bedtime when necessary  Alcohol withdrawal and cocaine use disorder: The patient will be started on Librium 50 mg by mouth 3 times a day and Ativan 2 mg by mouth every 8 hours when necessary CIWA score greater than 15. Vital signs will be check every 8 hours along with CIWA. He was encouraged to abstain from alcohol and all illicit drugs as they may worsen mood symptoms. The patient was also encouraged and her meaningful recovery program at the time of discharge.  Diabetes: Patient will be reassessed started on metformin at 1000mg  by mouth twice a day and glipizide 5 mg by mouth twice a day. Capillary blood sugar will be checked before every meal. Supplemental insulin has been in order. BS have been controlled HgA1c was 6.9  Hypertension: Patient has beenstarted on lisinopril 5 mg by mouth twice a day. VSS.   Atrial Fib: Continue Cardizem 120 mg by mouth daily  Dyslipidemia: Patient will be continued on Pravastatin 20mg  po daily  Nausea: Patient has been started on Phenergan 25 mg by mouth every 8 hours. I will order one dose of Phenergan at 50 mg now.  Labs: TSH was WNL and HgA1c was 6.9  Discharge planning: Patient is interested in treatment for substance abuse inpatient after discharge. I will discuss this with social worker and we will consider a referral to either [facility] or a long-term residential.       Daily contact with patient to assess and evaluate symptoms and progress in treatment and Medication  management  KAPUR,AARTI KAMAL 12/29/2014, 1:08 PM

## 2014-12-29 NOTE — Progress Notes (Signed)
D: Patient alert and oriented x4. Patient denies SI/HI/AVH. Patient undergoing alcohol withdrawal. Patient c/o mild nausea, agitation, anxiety, lightheadedness and depression. Patient reported an instance of liquid stool earlier in the day. Mild tremors and clammy palms noted upon assessment. Patient indicated he was sweating overnight. No seizure activity and no DTs. Patient slept in room most of day, but left room for meals. Patient pleasant and compliant, but did not offer much insight at this time as he said he was primarily focused on "not feeling sick."   A: Performed CIWA assessments. Administered scheduled medications and Phenegran prn for nausea. Provided active listening and support. Encouraged group attendance.  R: Tremor activity decreased in the evening. Patient indicated nausea slightly improved, but still persistent. No diaphoresis noted on second CIWA. Patient maintained isolation in room throughout shift, but indicated he would attend groups once he feels better. Will maintain Q15 min checks.

## 2014-12-29 NOTE — Plan of Care (Signed)
Problem: Alteration in mood & ability to function due to Goal: LTG-Pt verbalizes understanding of importance of med regimen (Patient verbalizes understanding of importance of medication regimen and need to continue outpatient care and support groups)  Outcome: Progressing Patient complied with all medication administration and verbalized understanding of importance of medication regimen.

## 2014-12-29 NOTE — [facility] Group Notes (Signed)
[facility] Group Notes:  (Nursing/MHT/Case Management/Adjunct)  Date:  12/29/2014  Time:  3:51 AM  Type of Therapy:  Group Therapy  Participation Level:  Did Not Attend   Summary of Progress/Problems:  Veva Holes 12/29/2014, 3:51 AM

## 2014-12-30 LAB — GLUCOSE, CAPILLARY
GLUCOSE-CAPILLARY: 181 mg/dL — AB (ref 65–99)
GLUCOSE-CAPILLARY: 59 mg/dL — AB (ref 65–99)
Glucose-Capillary: 65 mg/dL (ref 65–99)
Glucose-Capillary: 192 mg/dL — ABNORMAL HIGH (ref 65–99)

## 2014-12-30 MED ORDER — COLCHICINE 0.6 MG PO TABS
0.6000 mg | ORAL_TABLET | Freq: Two times a day (BID) | ORAL | Status: DC
  Administered 2014-12-30 – 2015-01-01 (×4): 0.6 mg via ORAL
  Filled 2014-12-30 (×5): qty 1

## 2014-12-30 MED ORDER — COLCHICINE 0.6 MG PO TABS
1.2000 mg | ORAL_TABLET | Freq: Once | ORAL | Status: AC
  Administered 2014-12-30: 1.2 mg via ORAL
  Filled 2014-12-30 (×2): qty 2

## 2014-12-30 NOTE — [facility] Counselor (Signed)
Adult Comprehensive Assessment  Patient ID: [name], male   DOB: 1966-02-25, 49 y.o.   MRN: [id number]  Information Source: Information source: Patient  Current Stressors:  Educational / Learning stressors: None reported  Employment / Job issues: Unemployed  Family Relationships: Distant relationship with family  Surveyor, quantity / Lack of resources (include bankruptcy): No income Housing / Lack of housing: Pt lives with cousin.  Physical health (include injuries & life threatening diseases): Diabetes, Gout,  Social relationships: None reported  Substance abuse: Pt reports drinking 5th per day  Bereavement / Loss: Recent break up with girlfriend.   Living/Environment/Situation:  Living Arrangements: Other relatives Living conditions (as described by patient or guardian): With cousin  How long has patient lived in current situation?: 2 months  What is atmosphere in current home: Temporary, Comfortable  Family History:  Marital status: Divorced Divorced, when?: Aug 12, 1991 What types of issues is patient dealing with in the relationship?: [place] deployments  Does patient have children?: Yes How many children?: 2 How is patient's relationship with their children?: Adult son and daughter, distant relationship   Childhood History:  By whom was/is the patient raised?: Both parents Description of patient's relationship with caregiver when they were a child: Good relationship with mother and father.  Patient's description of current relationship with people who raised him/her: Father died in Aug 11, 2005, good relationship with mother  Does patient have siblings?: Yes Number of Siblings: 8 Description of patient's current relationship with siblings: Distant relationship with siblings  Did patient suffer any verbal/emotional/physical/sexual abuse as a child?: No Did patient suffer from severe childhood neglect?: No Has patient ever been sexually abused/assaulted/raped as an adolescent or adult?:  No Was the patient ever a victim of a crime or a disaster?: No Witnessed domestic violence?: No Has patient been effected by domestic violence as an adult?: No  Education:  Highest grade of school patient has completed: 12th  Currently a student?: No Learning disability?: No  Employment/Work Situation:   Employment situation: Unemployed Patient's job has been impacted by current illness: No What is the longest time patient has a held a job?: 10 years  Where was the patient employed at that time?: [employer]  Has patient ever 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 Served 4 years   Architect:   Architect: No income Does patient have a Lawyer or guardian?: No  Alcohol/Substance Abuse:   What has been your use of drugs/alcohol within the last 12 months?: Pt reports drinking 5th per day. Occasional Cocaine use.  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 CSW assessing  Currently receiving community mental health services: No If no, would patient like referral for services when discharged?: Yes (What [place] ) Does patient have financial barriers related to discharge medications?: Yes Patient description of barriers related to discharge medications: No income, no insurance   Summary/Recommendations:   Elder is a 49 year old male who presented to [facility] with depression, SI and alcohol abuse. He reports increased alcohol intake 2-3 months ago. He reports drinking 5th of liquor per day at least and using cocaine a week ago. His BAC was 347 upon arrival. He is unemployed and lives with his cousin in [place]. 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 He would like to go to an inpatient substance abuse program. Recommendations include; crisis stabilization, medication management including medical detox, therapeutic milieu, and encourage group attendance and participation.   Chantel Teti L Hance Caspers. MSW, LCSWA  12/30/2014

## 2014-12-30 NOTE — Progress Notes (Signed)
Pt has been seclusive to his room. Pt continues detoxing well. No overt s/s of withdrawal noted. Pt c/o having gout pain and other body aches.Pt denies SI and A/V hallucinations. Pt did not attend any unit activities. Pt's mood and affect has been depressed.

## 2014-12-30 NOTE — Plan of Care (Signed)
Problem: Consults Goal: [facility] General Treatment Patient Education Outcome: Progressing Patient cooperative with plan of care for detox protocol. Reports irritability as only s/s of withdrawal at this time.

## 2014-12-30 NOTE — Progress Notes (Signed)
Patient with depressed affect and withdrawn behavior. Patient CIWA 1, he reports irritability as his s/s of withdrawal at this time. Minimal interaction with peers. Guarded when staff initiates interaction. No SI/HI at this time, safety maintained.

## 2014-12-30 NOTE — Progress Notes (Signed)
[facility] MD Progress Note  12/30/2014 2:31 PM [name]  MRN:  [id number] Subjective:   The patient is still experiencing some withdrawal symptoms including irritability and nausea. He denies any vomiting. The patient has had some mild visual hallucinations of "seeing spots" but denies any auditory hallucinations. He remained somewhat unsteady on his feet and has been reclusive to his room. He has not had any problems with confusion however and remains alert and fully oriented. The patient says he feels like his mood is a little better and he is not having any suicidal thoughts. He did speak with the social worker and is willing to consider going to a substance abuse residential rehabilitation center. He denies any current active or passive suicidal thoughts or psychotic symptoms. He denies any problems with insomnia and slept 8 hours last night. Blood pressure was slightly elevated this morning. He is also complaining of gout in both of his feet and left knee. He says he normally takes colchicine for the gout.    Past psychiatric history: The patient reports years of polysubstance abuse including alcohol dependence and intermittent cocaine use. He has never engaged in any active treatment for substance use. He has not had any treatment for depression in the past and denies any prior suicide attempts. He does report a prior inpatient psychiatric hospitalization for detox. He denies any history of any prior DTs.  Substance abuse history:  The patient does report a history of heavy alcohol use in the past and intermittent cocaine use. He denies any opiate dependence or cannabis use in the past. No history of any DTs.  Family psychiatric history: The patient reports that his dad, brother and sister are all alcoholics and he has a paternal uncle who was an alcoholic   Principal Problem: Severe recurrent major depression without psychotic features (HCC)   Diagnosis:   Patient Active Problem List   Diagnosis Date Noted  . Alcohol use disorder, severe, dependence (HCC) [F10.20] 12/28/2014  . Alcohol withdrawal (HCC) [F10.239] 12/28/2014  . Dyslipidemia [E78.5] 12/28/2014  . Stimulant use disorder- cocaine [F15.90] 12/28/2014  . Severe recurrent major depression without psychotic features (HCC) [F33.2] 12/27/2014  . Diabetes (HCC) [E11.9] 12/27/2014  . Hypertension [I10] 12/27/2014   Total Time spent with patient: 30 minutes   Past Medical History:  Past Medical History  Diagnosis Date  . CAD (coronary artery disease)     a. 2007 MI in [place], [place] ->Cath reportedly nl.  Marland Kitchen HTN (hypertension)   . Hyperlipidemia   . Borderline diabetes   . Cocaine abuse   . History of ETOH abuse     a. Cut back beginning in 2014.  Marland Kitchen PAF (paroxysmal atrial fibrillation) (HCC)     a. Dx in 2007->s/p DCCV x 2 (last "a few yrs ago");  b. Never anticoagulated (CHA2DS2VASc = 1).  . Obesity   . Diabetes mellitus without complication (HCC)    History reviewed. No pertinent past surgical history. Family History:  Family History  Problem Relation Age of Onset  . Lung cancer Mother    Social History:  History  Alcohol Use  . Yes    Comment: Previously drank heavily.  Cut back in 2014 but still drinks on occassion.     History  Drug Use  . 5.00 per week  . Special: "Crack" cocaine    Comment: Habitual crack cocaine usage.    Social History   Social History  . Marital Status: Single    Spouse Name: N/A  .  Number of Children: N/A  . Years of Education: N/A   Social History Main Topics  . Smoking status: Never Smoker   . Smokeless tobacco: Never Used  . Alcohol Use: Yes     Comment: Previously drank heavily.  Cut back in 2014 but still drinks on occassion.  . Drug Use: 5.00 per week    Special: "Crack" cocaine     Comment: Habitual crack cocaine usage.  Marland Kitchen Sexual Activity: No   Other Topics Concern  . None   Social History Narrative   The patient says his dad is deceased but his  mother still living. 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 He has filed for disability. He is currently divorced and has 2 children. He is living with his cousin.      He does report multiple legal charges related to DUI. He was also driving without a valid driver's license more recently and cannot drive.         Sleep: Good  Appetite:  Good  Current Medications: Current Facility-Administered Medications  Medication Dose Route Frequency Provider Last Rate Last Dose  . acetaminophen (TYLENOL) tablet 650 mg  650 mg Oral Q6H PRN Audery Amel, MD   650 mg at 12/30/14 0646  . alum & mag hydroxide-simeth (MAALOX/MYLANTA) 200-200-20 MG/5ML suspension 30 mL  30 mL Oral Q4H PRN Audery Amel, MD      . aspirin chewable tablet 81 mg  81 mg Oral Daily Jimmy Footman, MD   81 mg at 12/30/14 0929  . chlordiazePOXIDE (LIBRIUM) capsule 50 mg  50 mg Oral TID Jimmy Footman, MD   50 mg at 12/30/14 1610  . citalopram (CELEXA) tablet 20 mg  20 mg Oral Daily Audery Amel, MD   20 mg at 12/30/14 0929  . colchicine tablet 0.6 mg  0.6 mg Oral BID Darliss Ridgel, MD      . diltiazem (CARDIZEM CD) 24 hr capsule 120 mg  120 mg Oral Daily Jimmy Footman, MD   120 mg at 12/30/14 0929  . glipiZIDE (GLUCOTROL) tablet 5 mg  5 mg Oral BID AC Jimmy Footman, MD   5 mg at 12/30/14 0809  . insulin aspart (novoLOG) injection 0-9 Units  0-9 Units Subcutaneous TID WC Jimmy Footman, MD   2 Units at 12/30/14 [phone]  . lisinopril (PRINIVIL,ZESTRIL) tablet 5 mg  5 mg Oral Daily Jimmy Footman, MD   5 mg at 12/30/14 0930  . LORazepam (ATIVAN) tablet 2 mg  2 mg Oral TID PRN Jimmy Footman, MD      . magnesium hydroxide (MILK OF MAGNESIA) suspension 30 mL  30 mL Oral Daily PRN Audery Amel, MD      . metFORMIN (GLUCOPHAGE) tablet 1,000 mg  1,000 mg Oral BID WC Jimmy Footman, MD   1,000 mg at  12/30/14 0811  . multivitamin with minerals tablet 1 tablet  1 tablet Oral Daily Kerin Salen, MD   1 tablet at 12/30/14 [phone]  . pravastatin (PRAVACHOL) tablet 20 mg  20 mg Oral q1800 Jimmy Footman, MD   20 mg at 12/29/14 1745  . promethazine (PHENERGAN) tablet 25 mg  25 mg Oral Q8H PRN Jimmy Footman, MD   25 mg at 12/29/14 1805    Lab Results:  Results for orders placed or performed during the hospital encounter of 12/27/14 (from the past 48 hour(s))  Glucose, capillary     Status: Abnormal  Collection Time: 12/28/14  5:09 PM  Result Value Ref Range   Glucose-Capillary 117 (H) 65 - 99 mg/dL  Glucose, capillary     Status: Abnormal   Collection Time: 12/29/14  6:48 AM  Result Value Ref Range   Glucose-Capillary 144 (H) 65 - 99 mg/dL  Glucose, capillary     Status: None   Collection Time: 12/29/14 11:53 AM  Result Value Ref Range   Glucose-Capillary 88 65 - 99 mg/dL  Glucose, capillary     Status: Abnormal   Collection Time: 12/29/14  4:40 PM  Result Value Ref Range   Glucose-Capillary 136 (H) 65 - 99 mg/dL  Glucose, capillary     Status: Abnormal   Collection Time: 12/30/14  6:45 AM  Result Value Ref Range   Glucose-Capillary 181 (H) 65 - 99 mg/dL   Comment 1 Notify RN    Comment 2 Document in Chart   Glucose, capillary     Status: None   Collection Time: 12/30/14 12:05 PM  Result Value Ref Range   Glucose-Capillary 65 65 - 99 mg/dL   Comment 1 Notify RN   Glucose, capillary     Status: Abnormal   Collection Time: 12/30/14 12:08 PM  Result Value Ref Range   Glucose-Capillary 59 (L) 65 - 99 mg/dL   Comment 1 Notify RN     Physical Findings:  CIWA:  CIWA-Ar Total: 7  Musculoskeletal: Strength & Muscle Tone: within normal limits Gait & Station: normal Patient leans: N/A  Psychiatric Specialty Exam: Review of Systems  Constitutional: Negative.  Negative for fever, chills, weight loss and malaise/fatigue.  HENT: Negative.  Negative for  congestion, hearing loss, sore throat and tinnitus.   Eyes: Negative.  Negative for blurred vision, double vision, photophobia and pain.  Respiratory: Negative.  Negative for cough, hemoptysis, sputum production and wheezing.   Cardiovascular: Negative.  Negative for chest pain, palpitations, orthopnea and leg swelling.  Gastrointestinal: Positive for nausea. Negative for heartburn, vomiting, abdominal pain, diarrhea and constipation.  Genitourinary: Negative.  Negative for dysuria, urgency and frequency.  Musculoskeletal: Negative for myalgias, back pain, joint pain and neck pain.       Gout pain in his feet bilaterally and left knee  Skin: Negative.  Negative for itching and rash.  Neurological: Negative for dizziness, tingling, tremors, focal weakness, seizures and headaches.  Endo/Heme/Allergies: Negative.  Negative for environmental allergies. Does not bruise/bleed easily.    Blood pressure 148/87, pulse 102, temperature 98.9 F (37.2 C), temperature source Oral, resp. rate 20, height 6' (1.829 m), weight 109.77 kg (242 lb), SpO2 94 %.Body mass index is 32.81 kg/(m^2).  General Appearance: Casual  Eye Contact::  Good  Speech:  Clear and Coherent and Normal Rate  Volume:  Normal  Mood:  Depressed  Affect:  Depressed  Thought Process:  Goal Directed, Linear and Logical  Orientation:  Full (Time, Place, and Person)  Thought Content:  Hallucinations: Visual  Suicidal Thoughts:  No  Homicidal Thoughts:  No  Memory:  Immediate;   Good Recent;   Good Remote;   Good  Judgement:  Poor  Insight:  Fair  Psychomotor Activity:  Decreased  Concentration:  Fair  Recall:  Fair  Fund of Knowledge:Good  Language: Good  Akathisia:  Negative  Handed:  Right  AIMS (if indicated):     Assets:  Communication Skills Desire for Improvement  ADL's:  Intact  Cognition: WNL  Sleep:  Number of Hours: 7   Treatment Plan Summary:  Diagnosis:  Major depressive disorder, recurrent, severe without  psychotic features Alcohol use disorder, severe Cocaine use disorder, severe Diabetes Hypertension   Mr. Schifano is a 49 year-old divorced Caucasian male with history of alcohol use disorder severe cocaine use disorder severe and depression presented to our emergency department on September 29 voicing suicidal ideation. At presentation his alcohol level was > 300 and urine toxicology was positive for cocaine. Patient was hospitalized in our unit back in December under similar circumstances. The patient failed to follow-up after discharge. The patient has history of hypertension, diabetes and cardiovascular disease and he also has not been compliant with his treatment for this disorders.  Major depressive disorder: The patient has been started on Celexa 20 mg by mouth daily. No current suicidal thoughts  Insomnia: He was started on trazodone 100 mg by mouth daily at bedtime when necessary  Alcohol withdrawal and cocaine use disorder: The patient will be started on Librium 50 mg by mouth 3 times a day and Ativan 2 mg by mouth every 8 hours when necessary CIWA score greater than 15. Vital signs will be check every 8 hours along with CIWA. He was encouraged to abstain from alcohol and all illicit drugs as they may worsen mood symptoms. The patient was also encouraged and her meaningful recovery program at the time of discharge.  Diabetes: Patient will be reassessed started on metformin at 1000mg  by mouth twice a day and glipizide 5 mg by mouth twice a day. Capillary blood sugar will be checked before every meal. Supplemental insulin has been in order. BS have been controlled HgA1c was 6.9  Hypertension: Patient has beenstarted on lisinopril 5 mg by mouth twice a day. VSS.   Atrial Fib: Continue Cardizem 120 mg by mouth daily  Dyslipidemia: Patient will be continued on Pravastatin 20mg  po daily  Nausea: Patient has been started on Phenergan 25 mg by mouth every 8 hours. I will order one dose of  Phenergan at 50 mg now.  Labs: TSH was WNL and HgA1c was 6.9  Discharge planning: Patient is interested in treatment for substance abuse inpatient after discharge. I will discuss this with social worker and we will consider a referral to either [facility] or a long-term residential.      Daily contact with patient to assess and evaluate symptoms and progress in treatment and Medication management  KAPUR,AARTI KAMAL 12/30/2014, 2:31 PM

## 2014-12-30 NOTE — [facility] Group Notes (Signed)
[facility] LCSW Group Therapy  12/30/2014 2:40 PM  Type of Therapy:  Group Therapy  Participation Level:  Did Not Attend  Modes of Intervention:  Discussion, Education, Socialization and Support  Summary of Progress/Problems: Pt will identify unhealthy thoughts and how they impact their emotions and behavior. Pt will be encouraged to discuss these thoughts, emotions and behaviors with the group.    Shervon Kerwin L Jaelee Laughter MSW, LCSWA  12/30/2014, 2:40 PM 

## 2014-12-31 DIAGNOSIS — M109 Gout, unspecified: Secondary | ICD-10-CM

## 2014-12-31 DIAGNOSIS — I4891 Unspecified atrial fibrillation: Secondary | ICD-10-CM

## 2014-12-31 LAB — GLUCOSE, CAPILLARY
GLUCOSE-CAPILLARY: 102 mg/dL — AB (ref 65–99)
Glucose-Capillary: 145 mg/dL — ABNORMAL HIGH (ref 65–99)
Glucose-Capillary: 99 mg/dL (ref 65–99)

## 2014-12-31 MED ORDER — IBUPROFEN 800 MG PO TABS
800.0000 mg | ORAL_TABLET | Freq: Three times a day (TID) | ORAL | Status: AC
  Administered 2014-12-31 (×2): 800 mg via ORAL
  Filled 2014-12-31 (×3): qty 1

## 2014-12-31 MED ORDER — TRAMADOL HCL 50 MG PO TABS
50.0000 mg | ORAL_TABLET | Freq: Three times a day (TID) | ORAL | Status: DC
  Administered 2014-12-31 – 2015-01-01 (×2): 50 mg via ORAL
  Filled 2014-12-31 (×2): qty 1

## 2014-12-31 MED ORDER — CHLORDIAZEPOXIDE HCL 25 MG PO CAPS
25.0000 mg | ORAL_CAPSULE | Freq: Three times a day (TID) | ORAL | Status: DC
  Administered 2014-12-31 – 2015-01-01 (×4): 25 mg via ORAL
  Filled 2014-12-31 (×4): qty 1

## 2014-12-31 MED ORDER — ALLOPURINOL 100 MG PO TABS
100.0000 mg | ORAL_TABLET | Freq: Every day | ORAL | Status: DC
  Administered 2014-12-31 – 2015-01-01 (×2): 100 mg via ORAL
  Filled 2014-12-31 (×2): qty 1

## 2014-12-31 NOTE — [facility] Group Notes (Signed)
[facility] LCSW Aftercare Discharge Planning Group Note   12/31/2014 3:26 PM  Participation Quality:  Did Not Attend  Mood/Affect:  Did Not Attend  Depression Rating:    Anxiety Rating:    Thoughts of Suicide:  Did Not Attend Will you contract for safety?   Did Not Attend  Current AVH:  Did Not Attend  Plan for Discharge/Comments:    Transportation Means:   Supports:  Glennon Mac

## 2014-12-31 NOTE — Progress Notes (Signed)
D: Patient denies SI/HI/AVH.  Patient affect and mood are depressed.  Patient did attend evening group. Patient visible on the milieu. No distress noted. A: Support and encouragement offered. Scheduled medications given to pt. Q 15 min checks continued for patient safety. R: Patient receptive. Patient remains safe on the unit.   

## 2014-12-31 NOTE — Plan of Care (Signed)
Problem: Alteration in mood & ability to function due to Goal: LTG-Patient demonstrates decreased signs of withdrawal (Patient demonstrates decreased signs of withdrawal to the point the patient is safe to return home and continue treatment in an outpatient setting)  Outcome: Progressing Anxious this morning,verbalized feels better now.

## 2014-12-31 NOTE — Progress Notes (Signed)
He stayed in bed most of the day with c/o leg pain.Stated that he could not put any weight on his feet.Depressed & irritated this morning.Stated that he had AV hallucination.Denies suicidal ideation.Verbalized that his pain is better now.Visible in the milieu in  wheelchair.Compliant with meds.

## 2014-12-31 NOTE — [facility] Group Notes (Signed)
[facility] LCSW Group Therapy  12/31/2014 2:46 PM  Type of Therapy:  Group Therapy  Participation Level:  Did Not Attend   Lulu Riding, MSW, LCSWA 12/31/2014, 2:46 PM

## 2014-12-31 NOTE — Progress Notes (Signed)
[facility] MD Progress Note  12/31/2014 10:18 AM [name]  MRN:  [id number] Subjective: Patient was found sleeping in bed late in the morning. Per nursing patient has not been attending groups and has been withdrawn to his room since Friday. The patient was told that he needed to get out of bed and start her leaving the room and attending groups. He became defensive and irritable he said that we first need to fix his feet. Patient states he is having severe pain and nobody is doing anything about it.  He complains of swelling and not being able to walk or put any pressure on his feet.  He states he has been using the wheelchair in order to get around the unit.  Patient does have a history of gout and he has not been taking any treatment prior to admission. He is states that he has never had a gout attack on several joints at the same time.   Patient is still voicing depression. He states that last night he was hallucinating and talking to people that weren't there. Despite receiving Librium 50 mg every 8 hours since Friday his CIWA scores continue to be elevated.  However vital signs are stable.  CIWA scores are very subjective. I do not see it clinically any objective evidence of withdrawal. I will decrease his Librium to 25 mg 3 times a day.  Per literature review gout Attack multiple joints at the same time especially in patients that have suffered from gout for many years. Patient has been started on haloperidol and colchicine. I will also start him on ibuprofen today.   Patient is still voicing interest in substance abuse treatment. Referral for [facility] was done over the weekend. Social worker will follow up with them today.  We plan to discharge him tomorrow.   Past psychiatric history: The patient reports years of polysubstance abuse including alcohol dependence and intermittent cocaine use. He has never engaged in any active treatment for substance use. He has not had any treatment for depression  in the past and denies any prior suicide attempts. He does report a prior inpatient psychiatric hospitalization for detox. He denies any history of any prior DTs.  Substance abuse history:  The patient does report a history of heavy alcohol use in the past and intermittent cocaine use. He denies any opiate dependence or cannabis use in the past. No history of any DTs.  Family psychiatric history: The patient reports that his dad, brother and sister are all alcoholics and he has a paternal uncle who was an alcoholic   Principal Problem: Severe recurrent major depression without psychotic features (HCC)   Diagnosis:   Patient Active Problem List   Diagnosis Date Noted  . Gout [M10.9] 12/31/2014  . Atrial fibrillation (HCC) [I48.91] 12/31/2014  . Alcohol use disorder, severe, dependence (HCC) [F10.20] 12/28/2014  . Alcohol withdrawal (HCC) [F10.239] 12/28/2014  . Dyslipidemia [E78.5] 12/28/2014  . Stimulant use disorder- cocaine [F15.90] 12/28/2014  . Severe recurrent major depression without psychotic features (HCC) [F33.2] 12/27/2014  . Diabetes (HCC) [E11.9] 12/27/2014  . Hypertension [I10] 12/27/2014   Total Time spent with patient: 30 minutes   Past Medical History:  Past Medical History  Diagnosis Date  . CAD (coronary artery disease)     a. 2007 MI in [place], [place] ->Cath reportedly nl.  Marland Kitchen HTN (hypertension)   . Hyperlipidemia   . Borderline diabetes   . Cocaine abuse   . History of ETOH abuse  a. Cut back beginning in 2014.  Marland Kitchen PAF (paroxysmal atrial fibrillation) (HCC)     a. Dx in 2007->s/p DCCV x 2 (last "a few yrs ago");  b. Never anticoagulated (CHA2DS2VASc = 1).  . Obesity   . Diabetes mellitus without complication (HCC)    History reviewed. No pertinent past surgical history. Family History:  Family History  Problem Relation Age of Onset  . Lung cancer Mother    Social History:  History  Alcohol Use  . Yes    Comment: Previously drank heavily.  Cut  back in 2014 but still drinks on occassion.     History  Drug Use  . 5.00 per week  . Special: "Crack" cocaine    Comment: Habitual crack cocaine usage.    Social History   Social History  . Marital Status: Single    Spouse Name: N/A  . Number of Children: N/A  . Years of Education: N/A   Social History Main Topics  . Smoking status: Never Smoker   . Smokeless tobacco: Never Used  . Alcohol Use: Yes     Comment: Previously drank heavily.  Cut back in 2014 but still drinks on occassion.  . Drug Use: 5.00 per week    Special: "Crack" cocaine     Comment: Habitual crack cocaine usage.  Marland Kitchen Sexual Activity: No   Other Topics Concern  . None   Social History Narrative   The patient says his dad is deceased but his mother still living. 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 He has filed for disability. He is currently divorced and has 2 children. He is living with his cousin.      He does report multiple legal charges related to DUI. He was also driving without a valid driver's license more recently and cannot drive.         Sleep: Good  Appetite:  Good  Current Medications: Current Facility-Administered Medications  Medication Dose Route Frequency Provider Last Rate Last Dose  . acetaminophen (TYLENOL) tablet 650 mg  650 mg Oral Q6H PRN Audery Amel, MD   650 mg at 12/30/14 0646  . allopurinol (ZYLOPRIM) tablet 100 mg  100 mg Oral Daily Jimmy Footman, MD      . alum & mag hydroxide-simeth (MAALOX/MYLANTA) 200-200-20 MG/5ML suspension 30 mL  30 mL Oral Q4H PRN Audery Amel, MD      . aspirin chewable tablet 81 mg  81 mg Oral Daily Jimmy Footman, MD   81 mg at 12/30/14 0929  . chlordiazePOXIDE (LIBRIUM) capsule 25 mg  25 mg Oral TID Jimmy Footman, MD      . citalopram (CELEXA) tablet 20 mg  20 mg Oral Daily Audery Amel, MD   20 mg at 12/30/14 0929  . colchicine tablet 0.6 mg  0.6 mg  Oral BID Jimmy Footman, MD   0.6 mg at 12/30/14 2000  . diltiazem (CARDIZEM CD) 24 hr capsule 120 mg  120 mg Oral Daily Jimmy Footman, MD   120 mg at 12/30/14 0929  . glipiZIDE (GLUCOTROL) tablet 5 mg  5 mg Oral BID AC Jimmy Footman, MD   5 mg at 12/31/14 1610  . ibuprofen (ADVIL,MOTRIN) tablet 800 mg  800 mg Oral TID Jimmy Footman, MD      . insulin aspart (novoLOG) injection 0-9 Units  0-9 Units Subcutaneous TID WC Jimmy Footman, MD   1 Units at 12/31/14 0827  . lisinopril (  PRINIVIL,ZESTRIL) tablet 5 mg  5 mg Oral Daily Jimmy Footman, MD   5 mg at 12/30/14 0930  . magnesium hydroxide (MILK OF MAGNESIA) suspension 30 mL  30 mL Oral Daily PRN Audery Amel, MD      . metFORMIN (GLUCOPHAGE) tablet 1,000 mg  1,000 mg Oral BID WC Jimmy Footman, MD   1,000 mg at 12/31/14 0827  . pravastatin (PRAVACHOL) tablet 20 mg  20 mg Oral q1800 Jimmy Footman, MD   20 mg at 12/30/14 1748  . promethazine (PHENERGAN) tablet 25 mg  25 mg Oral Q8H PRN Jimmy Footman, MD   25 mg at 12/29/14 1805    Lab Results:  Results for orders placed or performed during the [name] encounter of 12/27/14 (from the past 48 hour(s))  Glucose, capillary     Status: None   Collection Time: 12/29/14 11:53 AM  Result Value Ref Range   Glucose-Capillary 88 65 - 99 mg/dL  Glucose, capillary     Status: Abnormal   Collection Time: 12/29/14  4:40 PM  Result Value Ref Range   Glucose-Capillary 136 (H) 65 - 99 mg/dL  Glucose, capillary     Status: Abnormal   Collection Time: 12/30/14  6:45 AM  Result Value Ref Range   Glucose-Capillary 181 (H) 65 - 99 mg/dL   Comment 1 Notify RN    Comment 2 Document in Chart   Glucose, capillary     Status: None   Collection Time: 12/30/14 12:05 PM  Result Value Ref Range   Glucose-Capillary 65 65 - 99 mg/dL   Comment 1 Notify RN   Glucose, capillary     Status: Abnormal   Collection  Time: 12/30/14 12:08 PM  Result Value Ref Range   Glucose-Capillary 59 (L) 65 - 99 mg/dL   Comment 1 Notify RN   Glucose, capillary     Status: Abnormal   Collection Time: 12/30/14  4:43 PM  Result Value Ref Range   Glucose-Capillary 192 (H) 65 - 99 mg/dL  Glucose, capillary     Status: Abnormal   Collection Time: 12/31/14  7:02 AM  Result Value Ref Range   Glucose-Capillary 145 (H) 65 - 99 mg/dL    Physical Findings:  CIWA:  CIWA-Ar Total: 7  Musculoskeletal: Strength & Muscle Tone: within normal limits Gait & Station: normal Patient leans: N/A  Psychiatric Specialty Exam: Review of Systems  Constitutional: Negative.  Negative for fever, chills, weight loss and malaise/fatigue.  HENT: Negative.  Negative for congestion, hearing loss, sore throat and tinnitus.   Eyes: Negative.  Negative for blurred vision, double vision, photophobia and pain.  Respiratory: Negative.  Negative for cough, hemoptysis, sputum production and wheezing.   Cardiovascular: Negative.  Negative for chest pain, palpitations, orthopnea and leg swelling.  Gastrointestinal: Negative for heartburn, nausea, vomiting, abdominal pain, diarrhea and constipation.  Genitourinary: Negative.  Negative for dysuria, urgency and frequency.  Musculoskeletal: Positive for joint pain. Negative for myalgias, back pain and neck pain.       Gout pain in his feet bilaterally and left knee  Skin: Negative.  Negative for itching and rash.  Neurological: Negative.  Negative for dizziness, tingling, tremors, focal weakness, seizures and headaches.  Endo/Heme/Allergies: Negative.  Negative for environmental allergies. Does not bruise/bleed easily.  Psychiatric/Behavioral: Positive for depression and hallucinations.    Blood pressure 125/84, pulse 97, temperature 98.2 F (36.8 C), temperature source Oral, resp. rate 20, height 6' (1.829 m), weight 109.77 kg (242 lb), SpO2 94 %.Body mass  index is 32.81 kg/(m^2).  General  Appearance: Casual  Eye Contact::  Good  Speech:  Clear and Coherent and Normal Rate  Volume:  Normal  Mood:  Depressed  Affect:  Depressed  Thought Process:  Goal Directed, Linear and Logical  Orientation:  Full (Time, Place, and Person)  Thought Content:  Hallucinations: Visual  Suicidal Thoughts:  No  Homicidal Thoughts:  No  Memory:  Immediate;   Good Recent;   Good Remote;   Good  Judgement:  Poor  Insight:  Fair  Psychomotor Activity:  Decreased  Concentration:  Fair  Recall:  Fair  Fund of Knowledge:Good  Language: Good  Akathisia:  Negative  Handed:  Right  AIMS (if indicated):     Assets:  Communication Skills Desire for Improvement  ADL's:  Intact  Cognition: WNL  Sleep:  Number of Hours: 7   Treatment Plan Summary:  [name] is a 49 year-old divorced Caucasian male with history of alcohol use disorder severe cocaine use disorder severe and depression presented to our emergency department on September 29 voicing suicidal ideation. At presentation his alcohol level was > 300 and urine toxicology was positive for cocaine. Patient was hospitalized in our unit back in December under similar circumstances. The patient failed to follow-up after discharge. The patient has history of hypertension, diabetes and cardiovascular disease and he also has not been compliant with his treatment for this disorders.  Major depressive disorder: The patient has been started on Celexa 20 mg by mouth daily. No current suicidal thoughts  Insomnia: He was started on trazodone 100 mg by mouth daily at bedtime when necessary  Alcohol withdrawal and cocaine use disorder: The patient has been started on a Librium taper.  Today I will decrease the Librium to 25 mg 3 times a day.  CIWA will be d/c and VS will  Be changed to q day.    Diabetes: Patient will be re started on metformin at  by mouth twice a day and glipizide 5 mg by mouth twice a day. Capillary blood sugar will be checked  before every meal. Supplemental insulin has been in order. BS have been controlled HgA1c was 6.9  Hypertension: Patient has beenstarted on lisinopril 5 mg by mouth twice a day. VSS.   Atrial Fib: Continue Cardizem 120 mg by mouth daily  Dyslipidemia: Patient will be continued on Pravastatin  po daily  Gout and gout attack: Started on allopurinol and colchicine. He will also received 3 doses of ibuprofen 800 mg  Nausea: Continue Phenergan when necessary  Labs: TSH was WNL and HgA1c was 6.9  Discharge planning: Patient and referral to [facility].  Plan to discharge tomorrow.     Daily contact with patient to assess and evaluate symptoms and progress in treatment and Medication management  Jimmy Footman 12/31/2014, 10:18 AM

## 2015-01-01 LAB — GLUCOSE, CAPILLARY
GLUCOSE-CAPILLARY: 87 mg/dL (ref 65–99)
GLUCOSE-CAPILLARY: 119 mg/dL — AB (ref 65–99)

## 2015-01-01 MED ORDER — GLIPIZIDE 5 MG PO TABS: 5.0000 mg | ORAL_TABLET | Freq: Two times a day (BID) | ORAL | Status: DC

## 2015-01-01 MED ORDER — COLCHICINE 0.6 MG PO TABS: 0.6000 mg | ORAL_TABLET | Freq: Two times a day (BID) | ORAL | Status: DC

## 2015-01-01 MED ORDER — METFORMIN HCL 1000 MG PO TABS: 1000.0000 mg | ORAL_TABLET | Freq: Two times a day (BID) | ORAL | Status: DC

## 2015-01-01 MED ORDER — LISINOPRIL 5 MG PO TABS: 5.0000 mg | ORAL_TABLET | Freq: Every day | ORAL | Status: DC

## 2015-01-01 MED ORDER — ALLOPURINOL 100 MG PO TABS: 100.0000 mg | ORAL_TABLET | Freq: Every day | ORAL | Status: DC

## 2015-01-01 MED ORDER — ASPIRIN 81 MG PO CHEW: 81.0000 mg | CHEWABLE_TABLET | Freq: Every day | ORAL | Status: AC

## 2015-01-01 MED ORDER — CITALOPRAM HYDROBROMIDE 20 MG PO TABS: 20.0000 mg | ORAL_TABLET | Freq: Every day | ORAL | Status: DC

## 2015-01-01 MED ORDER — DILTIAZEM HCL ER COATED BEADS 120 MG PO CP24: 120.0000 mg | ORAL_CAPSULE | Freq: Every day | ORAL | Status: DC

## 2015-01-01 MED ORDER — TRAMADOL HCL 50 MG PO TABS: 50.0000 mg | ORAL_TABLET | Freq: Three times a day (TID) | ORAL | Status: DC

## 2015-01-01 MED ORDER — PRAVASTATIN SODIUM 20 MG PO TABS: 20.0000 mg | ORAL_TABLET | Freq: Every day | ORAL | Status: DC

## 2015-01-01 MED ORDER — METFORMIN HCL 1000 MG PO TABS: 1000.0000 mg | ORAL_TABLET | Freq: Two times a day (BID) | ORAL | Status: AC

## 2015-01-01 NOTE — [facility] Suicide Risk Assessment (Signed)
[facility] Discharge Suicide Risk Assessment   Demographic Factors:  Male, Divorced or widowed, Caucasian, Low socioeconomic status and Unemployed  Total Time spent with patient: 30 minutes  Psychiatric Specialty Exam: Physical Exam  ROS                                                         Have you used any form of tobacco in the last 30 days? (Cigarettes, Smokeless Tobacco, Cigars, and/or Pipes): No  Has this patient used any form of tobacco in the last 30 days? (Cigarettes, Smokeless Tobacco, Cigars, and/or Pipes) No  Mental Status Per Nursing Assessment::   On Admission:     Current Mental Status by Physician: hopeful, and future oriented, motivated for treatment. Denies SI, HI or A/VH.  Mood euthymic and affect was reactive. Pt was calm and pleasant, cooperative  Loss Factors: Loss of significant relationship and Financial problems/change in socioeconomic status  Historical Factors: Family history of mental illness or substance abuse and Impulsivity  Risk Reduction Factors:   Living with another person, especially a relative and Positive social support  Continued Clinical Symptoms:  Depression:   Comorbid alcohol abuse/dependence Alcohol/Substance Abuse/Dependencies Previous Psychiatric Diagnoses and Treatments Medical Diagnoses and Treatments/Surgeries  Cognitive Features That Contribute To Risk:  None    Suicide Risk:  Minimal: No identifiable suicidal ideation.  Patients presenting with no risk factors but with morbid ruminations; may be classified as minimal risk based on the severity of the depressive symptoms  Principal Problem: Severe recurrent major depression without psychotic features [facility]) Discharge Diagnoses:  Patient Active Problem List   Diagnosis Date Noted  . Gout [M10.9] 12/31/2014  . Atrial fibrillation (HCC) [I48.91] 12/31/2014  . Alcohol use disorder, severe, dependence (HCC) [F10.20] 12/28/2014  . Alcohol withdrawal  (HCC) [F10.239] 12/28/2014  . Dyslipidemia [E78.5] 12/28/2014  . Stimulant use disorder- cocaine [F15.90] 12/28/2014  . Severe recurrent major depression without psychotic features (HCC) [F33.2] 12/27/2014  . Diabetes (HCC) [E11.9] 12/27/2014  . Hypertension [I10] 12/27/2014      Is patient on multiple antipsychotic therapies at discharge:  No   Has Patient had three or more failed trials of antipsychotic monotherapy by history:  No  Recommended Plan for Multiple Antipsychotic Therapies: NA    Jimmy Footman 01/01/2015, 10:15 AM

## 2015-01-01 NOTE — Progress Notes (Signed)
  [facility] Adult Case Management Discharge Plan :  Will you be returning to the same living situation after discharge:  Yes,  home with his cousin for a few days and plans to go to a faith-based residential treatment program [facility] [phone] At discharge, do you have transportation home?: Yes,  patient's mother will pick up at discharge Do you have the ability to pay for your medications: No. [facility]  Release of information consent forms completed and in the chart;  Patient's signature needed at discharge.  Patient to Follow up at: Follow-up Information    Follow up with [facility].   Why:  For follow-up care patient plans to discharge and attend a faith-based residential treatment program and will walk in to [facility] once treatment is completed for outpatient services assessment   Contact information:   [address] [place], [place] Ph [phone] Fax [phone] (Walk-in hours MWF 8-3)      Follow up with [facility]. Schedule an appointment as soon as possible for a visit in 1 week.   Why:  Follow up gout, diabetes, and blood pressure   Contact information:   [address] [place] [address] [phone]      Patient denies SI/HI: Yes,  patient denies SI/HI    Safety Planning and Suicide Prevention discussed: Yes,  SPE discussed with patient but patient refused family contact for further SPE  Have you used any form of tobacco in the last 30 days? (Cigarettes, Smokeless Tobacco, Cigars, and/or Pipes): No  Has patient been referred to the Quitline?: N/A patient is not a smoker  Beryl Meager T, MSW, LCSWA 01/01/2015, 12:40 PM

## 2015-01-01 NOTE — Discharge Summary (Signed)
Physician Discharge Summary Note  Patient:  [name] is an 49 y.o., male MRN:  [id number] DOB:  1966/02/10 Patient phone:  [phone] (home)  Patient address:   [place] [address],  Total Time spent with patient: 30 minutes  Date of Admission:  12/27/2014 Date of Discharge: 01/01/15  Reason for Admission:  Depression and suicidal thoughts  Principal Problem: Severe recurrent major depression without psychotic features [facility]) Discharge Diagnoses: Patient Active Problem List   Diagnosis Date Noted  . Gout [M10.9] 12/31/2014  . Atrial fibrillation ([place]) [I48.91] 12/31/2014  . Alcohol use disorder, severe, dependence ([place]) [F10.20] 12/28/2014  . Alcohol withdrawal ([place]) [F10.239] 12/28/2014  . Dyslipidemia [E78.5] 12/28/2014  . Stimulant use disorder- cocaine [F15.90] 12/28/2014  . Severe recurrent major depression without psychotic features ([place]) [F33.2] 12/27/2014  . Diabetes ([place]) [E11.9] 12/27/2014  . Hypertension [I10] 12/27/2014    Musculoskeletal: Strength & Muscle Tone: within normal limits Gait & Station: normal Patient leans: N/A  Psychiatric Specialty Exam: Physical Exam  Constitutional: He is oriented to person, place, and time. He appears well-developed and well-nourished.  HENT:  Head: Normocephalic and atraumatic.  Eyes: Conjunctivae and EOM are normal.  Neck: Normal range of motion.  Respiratory: Effort normal.  Musculoskeletal: Normal range of motion.  Neurological: He is alert and oriented to person, place, and time.    Review of Systems  Constitutional: Negative.   HENT: Negative.   Eyes: Negative.   Respiratory: Negative.   Cardiovascular: Negative.   Gastrointestinal: Negative.   Genitourinary: Negative.   Musculoskeletal: Positive for joint pain. Negative for myalgias, back pain, falls and neck pain.  Skin: Negative.   Neurological: Negative.   Endo/Heme/Allergies: Negative.   Psychiatric/Behavioral: Positive for  depression and substance abuse. Negative for suicidal ideas, hallucinations and memory loss. The patient is not nervous/anxious and does not have insomnia.     Blood pressure 111/80, pulse 66, temperature 97.7 F (36.5 C), temperature source Oral, resp. rate 18, height 6' (1.829 m), weight 109.77 kg (242 lb), SpO2 94 %.Body mass index is 32.81 kg/(m^2).  General Appearance: Well Groomed  Engineer, water::  Good  Speech:  Clear and Coherent  Volume:  Normal  Mood:  Euthymic  Affect:  Congruent  Thought Process:  Intact  Orientation:  Full (Time, Place, and Person)  Thought Content:  Hallucinations: None  Suicidal Thoughts:  No  Homicidal Thoughts:  No  Memory:  Immediate;   Good Recent;   Good Remote;   Good  Judgement:  Fair  Insight:  Fair  Psychomotor Activity:  Normal  Concentration:  NA  Recall:  NA  Fund of Knowledge:Good  Language: Good  Akathisia:  No  Handed:    AIMS (if indicated):     Assets:  Communication Skills Desire for Improvement Housing Social Support Vocational/Educational  ADL's:  Intact  Cognition: WNL  Sleep:  Number of Hours: 6.75   History of Present Illness: [name] is a 49 y.o. male who presented to the ED on 4/43 brought by police from home for alcohol intoxication, depression with suicidal ideation. Patient has a history of EtOH abuse, MDD with prior suicide attempt.No specific plan; "just tired of being here". Also has a history of cocaine use but states he has not used in a few days.  Patient called 911, requesting help for his depression and having thoughts of suicide. Patient states, he has no specific plan but has had ongoing thoughts of dying for the last  two months. Over the last week, he has had an increase of SI. Patient was unable to give the specific causes of it. He states, "My life just sucks. It's everything. Nothing works out for me."  Patient recently moved in with is cousin, approximately two months ago. Prior to that, he  was living in [place], [place], with his girlfriend. They were together for approximately one year. Per his report, they came to a mutual agreement, about ending the relationship. Patient also states, he is currently unemployed and "can't catch a break."  He has been Inpatient with [facility], 02/[facility] for similar presentation. He states he didn't follow up with [facility] for their SAIOP, as instructed upon discharge.  Patient admits to Alcohol and Cocaine use. His UDS was positive for cocaine. Upon arrival to the ER, his BAC was 357.  Per ER psychiatrist: "Patient with a history of alcohol abuse and depression comes into the hospital reporting suicidal thoughts. He says his mood is been very sad and depressed for at least a month and is getting worse. He feels down all the time. He doesn't enjoy anything and is negative about the future. His sleep is that he is fatigued and sleepy all the time. Eating sporadically. He is having suicidal thoughts with a plan of walking out in front of traffic. Denies homicidal ideation. He has not had auditory or visual hallucinations. He is not getting any psychiatric treatment currently. He has been drinking very heavily. About a fifth of liquor a day. This is been going on for a couple months and is also getting worse. Admits he is also intermittently abused cocaine. In addition to the obvious substance abuse stress includes having little social support and being out of work and his chronic medical problems."  Today the patient was interviewed he was vague and superficial. He stated that he felt sick and complaining of feeling nervous, and having nausea and vomiting along with diarrhea. The patient states that everything in his life is not going well. He has been feeling suicidal for about a month. He denies having a suicidal plan. He states that his thoughts were "people will be better off if I'm not around". Patient is states he has been unemployed for about a year he has not been  able to find work. 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 Patient is states yesterday he did not wanted to be alive anymore and therefore he called the police and asked them to bring him to the hospital. Patient states he has been living with his cousin in the [place]. Patient denies HI or auditory or visual hallucinations. He does report depressed mood, anhedonia, insomnia, poor energy and poor concentration.   Substance abuse history: Years of alcohol abuse. No significant periods of sobriety. Denies known history of seizures from alcohol withdrawal. Patient is states he's been drinking daily about a fifth of alcohol per day. He also has been using crack about 3-4 times a week. He denies the use of any other illicit substances. He denies the use of cigarettes. Patient reports having at least 3 DWIs in the past. Denies history of treatment in rehabilitation or detox. He was involved with AA in the past but not currently.   Associated Signs/Symptoms: Depression Symptoms: depressed mood, feelings of worthlessness/guilt, recurrent thoughts of death,  Total Time spent with patient: 1 hour  Past Psychiatric History: Patient states he was hospitalized in our behavioral health unit back in  December 2015 for depression and alcoholism. After discharge the patient failed to follow up. He states he did not have transportation to his appointments. Patient denies history of suicidal attempts. He denies history of self-injurious behaviors. Risk to Self: Is patient at risk for suicide?: No Risk to Others:   no Prior Inpatient Therapy:   yes Prior Outpatient Therapy:   no  Alcohol Screening: 1. How often do you have a drink containing alcohol?: 4 or more times a week 2. How many drinks containing alcohol do you have on a typical day when you are drinking?: 10 or more 3. How often do you have six or more drinks on one occasion?: Daily or almost  daily Preliminary Score: 8 4. How often during the last year have you found that you were not able to stop drinking once you had started?: Daily or almost daily 5. How often during the last year have you failed to do what was normally expected from you becasue of drinking?: Less than monthly 6. How often during the last year have you needed a first drink in the morning to get yourself going after a heavy drinking session?: Less than monthly 7. How often during the last year have you had a feeling of guilt of remorse after drinking?: Weekly 8. How often during the last year have you been unable to remember what happened the night before because you had been drinking?: Less than monthly 9. Have you or someone else been injured as a result of your drinking?: No 10. Has a relative or friend or a doctor or another health worker been concerned about your drinking or suggested you cut down?: Yes, during the last year Alcohol Use Disorder Identification Test Final Score (AUDIT): 26 Brief Intervention: Yes   Past Medical History: In December 2015 the patient was admitted to the medical floor for cocaine induced chest pain. He was discharged on the following medications: Lisinopril 5 mg daily, glipizide 5 mg b.i.d., gabapentin 100 mg t.i.d., metformin 1000 mg b.i.d., aspirin 81 mg daily, Cardizem CD 120 mg daily, lovastatin 20 mg daily, allopurinol 100 mg daily, colchicine 0.6 mg 1 capsule b.i.d. per admission record the patient was not taking any of those medications at admission. Patient states he has not been compliant with any of his treatments for several months because he did not have transportation. [facility]  Past Medical History  Diagnosis Date  . CAD (coronary artery disease)     a. 2007 MI in [place], [place] ->Cath reportedly nl.  Marland Kitchen HTN (hypertension)   . Hyperlipidemia   . Borderline diabetes   . Cocaine abuse   .  History of ETOH abuse     a. Cut back beginning in 2014.  Marland Kitchen PAF (paroxysmal atrial fibrillation)     a. Dx in 2007->s/p DCCV x 2 (last "a few yrs ago"); b. Never anticoagulated (CHA2DS2VASc = 1).  . Obesity   . Diabetes mellitus without complication    History reviewed. No pertinent past surgical history.  Family History:  Family History  Problem Relation Age of Onset  . Lung cancer Mother    Family Psychiatric History: Patient reports that his father, Brother and uncles all suffer from alcoholism. There is no history of mental illness in his family. There is no history of suicidal attempts in his family  Social History: Patient is currently divorced. He has 2 children ages 36 and 23 states he gets along with  them. He has been living with his cousin [place] He has been unemployed for about 1 year. Supports himself by picking up at jobs. He has high school education. In the past he worked in Information systems manager. He states he has been looking for a job for about 1 year with no luck. Denies having any current legal charges but in the past he was charged with 3 DWIs. History  Alcohol Use  . Yes    Comment: Previously drank heavily. Cut back in 2014 but still drinks on occassion.    History  Drug Use  . 5.00 per week  . Special: "Crack" cocaine    Comment: Habitual crack cocaine usage.    Social History   Social History  . Marital Status: Single    Spouse Name: N/A  . Number of Children: N/A  . Years of Education: N/A   Social History Main Topics  . Smoking status: Never Smoker   . Smokeless tobacco: Never Used  . Alcohol Use: Yes     Comment: Previously drank heavily. Cut back in 2014 but still drinks on occassion.  . Drug Use: 5.00 per week    Special: "Crack" cocaine     Comment: Habitual crack cocaine usage.  Marland Kitchen Sexual Activity: No   Other Topics Concern  . None    Social History Narrative   Lives in [place] with his mother. 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         Hospital Course:    Mr. Bahl is a 49 year-old divorced Caucasian male with history of alcohol use disorder severe cocaine use disorder severe and depression presented to our emergency department on September 29 voicing suicidal ideation. At presentation his alcohol level was > 300 and urine toxicology was positive for cocaine. Patient was hospitalized in our unit back in December under similar circumstances. The patient failed to follow-up after discharge. The patient has history of hypertension, diabetes and cardiovascular disease and he also has not been compliant with his treatment for this disorders.  Major depressive disorder: The patient has been started on Celexa 20 mg by mouth daily. No current suicidal thoughts  Insomnia: He was started on trazodone 100 mg by mouth daily at bedtime when necessary  Alcohol withdrawal and cocaine use disorder: Patient received Librium which was tapered off prior to discharge. His withdrawal was uncomplicated.  Diabetes: Patient was restarted on metformin at $RemoveBefo'1000mg'AimkJcJLYJn$  by mouth twice a day and glipizide 5 mg by mouth twice a day.  BS have been well  controlled. HgA1c was 6.9  Hypertension: Patient has been restarted on lisinopril 5 mg by mouth twice a day. VSS.   Atrial Fib: Continue Cardizem 120 mg by mouth daily  Dyslipidemia: Patient will be continued on Pravastatin $RemoveBefore'20mg'fDzNQPoCVJYQs$  po daily  Gout and gout attack: During his stay the patient developed a polyarthritic gout attack. He was restarted on = allopurinol and colchicine. He also received 3 doses of ibuprofen 800 mg.  Tramadol was started as needed.  Patient reports improvement today.  Labs: TSH was WNL and HgA1c was 6.9  Discharge planning: Referral to [facility] was completed and they called the unit today as they have accepted the patient. However  Mr. Pulliam stated that he needed to go home first and take care of his dogs and some bills before being able to go. It was explained to him that they will not take patients from home.  Patient then received information for [facility] Based residential programs in the area. The patient was very interested and stated that he was planning on contacting the program in [facility].   On the day of the discharge the patient appeared much improved. His mood was euthymic and his affect was brighter and reactive. He was hopeful and future oriented. Motivated for treatment. He denied any thoughts of suicide, homicide or hopelessness or helplessness. He denied psychotic symptoms. Denied side effects from his medications. Denies physical complaints, other than the pain from the gout attack which he reported was much improved.      [facility]    7 days of free medications were provided to the patient   Discharge Vitals:   Blood pressure 111/80, pulse 66, temperature 97.7 F (36.5 C), temperature source Oral, resp. rate 18, height 6' (1.829 m), weight 109.77 kg (242 lb), SpO2 94 %. Body mass index is 32.81 kg/(m^2).  Lab Results:   Results for VERMON, GRAYS (MRN [id number]) as of 01/01/2015 10:20  Ref. Range 12/27/2014 02:31 12/27/2014 06:41 12/28/2014 10:55  Sodium Latest Ref Range: 135-145 mmol/L 139    Potassium Latest Ref Range: 3.5-5.1 mmol/L 3.8    Chloride Latest Ref Range: 101-111 mmol/L 105    CO2 Latest Ref Range: 22-32 mmol/L 22    BUN Latest Ref Range: 6-20 mg/dL 5 (L)    Creatinine Latest Ref Range: 0.61-1.24 mg/dL 0.87    Calcium Latest Ref Range: 8.9-10.3 mg/dL 8.9    EGFR (Non-African Amer.) Latest Ref Range: >60 mL/min >60    EGFR (African American) Latest Ref Range: >60 mL/min >60    Glucose Latest Ref Range: 65-99 mg/dL 175 (H)    Anion gap Latest Ref Range: 5-15  12    Alkaline Phosphatase Latest Ref Range: 38-126 U/L 64    Albumin  Latest Ref Range: 3.5-5.0 g/dL 4.4    AST Latest Ref Range: 15-41 U/L 26    ALT Latest Ref Range: 17-63 U/L 19    Total Protein Latest Ref Range: 6.5-8.1 g/dL 8.4 (H)    Total Bilirubin Latest Ref Range: 0.3-1.2 mg/dL 0.6    Troponin I Latest Ref Range: <0.031 ng/mL <0.03 <0.03   WBC Latest Ref Range: 3.8-10.6 K/uL 8.4    RBC Latest Ref Range: 4.40-5.90 MIL/uL 5.68    Hemoglobin Latest Ref Range: 13.0-18.0 g/dL 16.5    HCT Latest Ref Range: 40.0-52.0 % 49.1    MCV Latest Ref Range: 80.0-100.0 fL 86.5    MCH Latest Ref Range: 26.0-34.0 pg 29.0    MCHC Latest Ref Range: 32.0-36.0 g/dL 33.6    RDW Latest Ref Range: 11.5-14.5 % 15.5 (H)    Platelets Latest Ref Range: 370-488 K/uL 891    Salicylate Lvl Latest Ref Range: 2.8-30.0 mg/dL <4.0    Acetaminophen Latest Ref Range: 10-30 ug/mL <10 (L)    Hemoglobin A1C Latest Ref Range: 4.0-6.0 %   6.9 (H)  TSH Latest Ref Range: 0.350-4.500 uIU/mL   3.223  Alcohol, Ethyl (B) Latest Ref Range: <5 mg/dL 357 (HH)    Amphetamines, Ur Screen Latest Ref Range: NONE DETECTED  NONE DETECTED    Barbiturates, Ur Screen Latest Ref Range: NONE DETECTED  NONE DETECTED    Benzodiazepine, Ur Scrn Latest Ref Range: NONE DETECTED  NONE DETECTED    Cocaine Metabolite,Ur [place] Latest Ref Range: NONE DETECTED  POSITIVE (A)    Methadone Scn, Ur Latest Ref Range:  NONE DETECTED  NONE DETECTED    MDMA (Ecstasy)Ur Screen Latest Ref Range: NONE DETECTED  NONE DETECTED    Cannabinoid 50 Ng, Ur [place] Latest Ref Range: NONE DETECTED  NONE DETECTED    Opiate, Ur Screen Latest Ref Range: NONE DETECTED  NONE DETECTED    Phencyclidine (PCP) Ur S Latest Ref Range: NONE DETECTED  NONE DETECTED    Tricyclic, Ur Screen Latest Ref Range: NONE DETECTED  NONE DETECTED     Physical Findings: AIMS:  , ,  ,  ,    CIWA:  CIWA-Ar Total: 7 COWS:            Discharge Instructions    Diet - low sodium heart healthy    Complete by:  As directed             Medication List    TAKE  these medications      Indication   allopurinol 100 MG tablet  Commonly known as:  ZYLOPRIM  Take 1 tablet (100 mg total) by mouth daily.  Notes to Patient:  gout      aspirin 81 MG chewable tablet  Chew 1 tablet (81 mg total) by mouth daily.  Notes to Patient:  gout      citalopram 20 MG tablet  Commonly known as:  CELEXA  Take 1 tablet (20 mg total) by mouth daily.  Notes to Patient:  depression      colchicine 0.6 MG tablet  Take 1 tablet (0.6 mg total) by mouth 2 (two) times daily.  Notes to Patient:  gout      diltiazem 120 MG 24 hr capsule  Commonly known as:  CARDIZEM CD  Take 1 capsule (120 mg total) by mouth daily.  Notes to Patient:  Atrial fibrillation      glipiZIDE 5 MG tablet  Commonly known as:  GLUCOTROL  Take 1 tablet (5 mg total) by mouth 2 (two) times daily before a meal.  Notes to Patient:  diabetes      lisinopril 5 MG tablet  Commonly known as:  PRINIVIL,ZESTRIL  Take 1 tablet (5 mg total) by mouth daily.  Notes to Patient:  Blood pressure      metFORMIN 1000 MG tablet  Commonly known as:  GLUCOPHAGE  Take 1 tablet (1,000 mg total) by mouth 2 (two) times daily with a meal.  Notes to Patient:  diabetes      pravastatin 20 MG tablet  Commonly known as:  PRAVACHOL  Take 1 tablet (20 mg total) by mouth daily at 6 PM.  Notes to Patient:  cholesterol      traMADol 50 MG tablet  Commonly known as:  ULTRAM  Take 1 tablet (50 mg total) by mouth 3 (three) times daily.  Notes to Patient:  pain          Total Discharge Time: 30 minutes  Signed: Hildred Priest 01/01/2015, 11:48 AM

## 2015-01-01 NOTE — Progress Notes (Addendum)
D: Pt is awake and active in the milieu this evening. Pt mood is depressed and his affect is sad/flat. Pt denies SI/HI and AVH at this time. Pt is still c/o chronic foot pain but states he is able to ambulate to and from bathroom successfully. Pt utilizing wheelchair to get around the unit. Pt denies withdrawal symptoms.  A: Writer provided emotional support and administered medications as prescribed. Writer also provided print out of medication regimen as requested by pt.   R: Pt behavior is appropriate on the unit and he is pleasant and cooperative with staff. Pt went to bed following medication administration.

## 2015-01-01 NOTE — Progress Notes (Signed)
D:Patient denies any SI/HI/AH, aware of discharge today to home via cab noted to have a bright affect and ready to go.. Patient refused to complete ADLs during the day.  A:. Patient is aware of follow up appointments. Patient received his prescriptions with a 7 day supply of medications.  R: Receptive to information received and had no further concerns. All personal belongings have been returned. RN escorted patient off the unit to awaiting cab.

## 2015-01-01 NOTE — [facility] Suicide Risk Assessment (Signed)
[facility] INPATIENT:  Family/Significant Other Suicide Prevention Education  Suicide Prevention Education:  Patient Refusal for Family/Significant Other Suicide Prevention Education: The patient [name] has refused to provide written consent for family/significant other to be provided Family/Significant Other Suicide Prevention Education during admission and/or prior to discharge.  Physician notified.  Lulu Riding, MSW, LCSWA 01/01/2015, 12:39 PM

## 2015-01-01 NOTE — Plan of Care (Signed)
Problem: Alteration in mood & ability to function due to Goal: STG-Patient will attend groups Outcome: Progressing Pt is attending most groups.

## 2015-03-28 ENCOUNTER — Emergency Department
Admission: EM | Admit: 2015-03-28 | Discharge: 2015-03-29 | Disposition: A | Discharge status: Home / Self Care | Attending: Emergency Medicine | Admitting: Emergency Medicine

## 2015-03-28 DIAGNOSIS — F10120 Alcohol abuse with intoxication, uncomplicated: Secondary | ICD-10-CM | POA: Insufficient documentation

## 2015-03-28 DIAGNOSIS — E119 Type 2 diabetes mellitus without complications: Secondary | ICD-10-CM | POA: Insufficient documentation

## 2015-03-28 DIAGNOSIS — F10920 Alcohol use, unspecified with intoxication, uncomplicated: Secondary | ICD-10-CM

## 2015-03-28 DIAGNOSIS — R079 Chest pain, unspecified: Secondary | ICD-10-CM | POA: Insufficient documentation

## 2015-03-28 DIAGNOSIS — Z7982 Long term (current) use of aspirin: Secondary | ICD-10-CM | POA: Insufficient documentation

## 2015-03-28 DIAGNOSIS — Z79899 Other long term (current) drug therapy: Secondary | ICD-10-CM | POA: Insufficient documentation

## 2015-03-28 DIAGNOSIS — I10 Essential (primary) hypertension: Secondary | ICD-10-CM | POA: Insufficient documentation

## 2015-03-28 DIAGNOSIS — Z7984 Long term (current) use of oral hypoglycemic drugs: Secondary | ICD-10-CM | POA: Insufficient documentation

## 2015-03-28 DIAGNOSIS — I2510 Atherosclerotic heart disease of native coronary artery without angina pectoris: Secondary | ICD-10-CM | POA: Insufficient documentation

## 2015-03-28 NOTE — ED Notes (Signed)
Pt to ED via EMS from home c/o chest pain that started around 2250 tonight.  Per EMS pt c/o substernal CP radiating to left shoulder, patient took 324 ASA at home prior to EMS arrival, EMS administered 1 nitro SL en route which relieved pain from 10/10 to 5/10.  Per EMS patient seen in October dx with A.fib.  EMS states patient lost son and mother this year.  Pt is A&Ox4, nodding appropriately, and in NAD at this time.

## 2015-03-29 ENCOUNTER — Emergency Department

## 2015-03-29 ENCOUNTER — Encounter: Payer: Self-pay | Admitting: Emergency Medicine

## 2015-03-29 LAB — TROPONIN I: Troponin I: <0.03 ng/mL

## 2015-03-29 LAB — BASIC METABOLIC PANEL WITH GFR
Anion gap: 9 (ref 5–15)
BUN: 13 mg/dL (ref 6–20)
CO2: 26 mmol/L (ref 22–32)
Calcium: 8.5 mg/dL — ABNORMAL LOW (ref 8.9–10.3)
Chloride: 110 mmol/L (ref 101–111)
Creatinine, Ser: 0.91 mg/dL (ref 0.61–1.24)
GFR calc Af Amer: >60 mL/min
GFR calc non Af Amer: >60 mL/min
Glucose, Bld: 147 mg/dL — ABNORMAL HIGH (ref 65–99)
Potassium: 3.5 mmol/L (ref 3.5–5.1)
Sodium: 145 mmol/L (ref 135–145)

## 2015-03-29 LAB — CBC
HCT: 46.5 % (ref 40.0–52.0)
Hemoglobin: 15.6 g/dL (ref 13.0–18.0)
MCH: 29.2 pg (ref 26.0–34.0)
MCHC: 33.6 g/dL (ref 32.0–36.0)
MCV: 87.1 fL (ref 80.0–100.0)
Platelets: 250 K/uL (ref 150–440)
RBC: 5.34 MIL/uL (ref 4.40–5.90)
RDW: 15 % — ABNORMAL HIGH (ref 11.5–14.5)
WBC: 5.2 K/uL (ref 3.8–10.6)

## 2015-03-29 LAB — ETHANOL: ALCOHOL ETHYL (B): 348 mg/dL — AB (ref <5)

## 2015-03-29 MED ORDER — SODIUM CHLORIDE 0.9 % IV BOLUS (SEPSIS)
1000.0000 mL | Freq: Once | INTRAVENOUS | Status: AC
  Administered 2015-03-29: 1000 mL via INTRAVENOUS

## 2015-03-29 MED ORDER — DIPHENHYDRAMINE HCL 50 MG/ML IJ SOLN
25.0000 mg | Freq: Once | INTRAMUSCULAR | Status: AC
  Administered 2015-03-29: 25 mg via INTRAVENOUS

## 2015-03-29 MED ORDER — DIPHENHYDRAMINE HCL 50 MG/ML IJ SOLN
INTRAMUSCULAR | Status: AC
  Administered 2015-03-29: 25 mg via INTRAVENOUS
  Filled 2015-03-29: qty 1

## 2015-03-29 MED ORDER — DIPHENHYDRAMINE HCL 50 MG/ML IJ SOLN
25.0000 mg | Freq: Once | INTRAMUSCULAR | Status: AC
  Administered 2015-03-29: 25 mg via INTRAVENOUS
  Filled 2015-03-29: qty 1

## 2015-03-29 MED ORDER — KETOROLAC TROMETHAMINE 30 MG/ML IJ SOLN
INTRAMUSCULAR | Status: AC
  Administered 2015-03-29: 30 mg via INTRAVENOUS
  Filled 2015-03-29: qty 1

## 2015-03-29 MED ORDER — KETOROLAC TROMETHAMINE 30 MG/ML IJ SOLN
30.0000 mg | Freq: Once | INTRAMUSCULAR | Status: AC
  Administered 2015-03-29: 30 mg via INTRAVENOUS

## 2015-03-29 NOTE — ED Provider Notes (Signed)
[facility] Emergency Department Provider Note  ____________________________________________  Time seen: 12:10AM  I have reviewed the triage vital signs and the nursing notes.   HISTORY  Chief Complaint Chest Pain      HPI [name] is a 49 y.o. male presents via EMS with history of acute onset of chest pain at 1050 tonight that is substernal radiating to the left shoulder. Patient states that he took 324 mg of aspirin before presenting to the emergency department. He was given one sublingual nitroglycerin by EMS which decreased his pain from 10 out of 10-5 out of 10. Patient denies any alcohol or drug use  Past Medical History  Diagnosis Date  . CAD (coronary artery disease)     a. 2007 MI in [place], [place] ->Cath reportedly nl.  Marland Kitchen HTN (hypertension)   . Hyperlipidemia   . Borderline diabetes   . Cocaine abuse   . History of ETOH abuse     a. Cut back beginning in 2014.  Marland Kitchen PAF (paroxysmal atrial fibrillation) (HCC)     a. Dx in 2007->s/p DCCV x 2 (last "a few yrs ago");  b. Never anticoagulated (CHA2DS2VASc = 1).  . Obesity   . Diabetes mellitus without complication [facility])     Patient Active Problem List   Diagnosis Date Noted  . Gout 12/31/2014  . Atrial fibrillation (HCC) 12/31/2014  . Alcohol use disorder, severe, dependence (HCC) 12/28/2014  . Alcohol withdrawal (HCC) 12/28/2014  . Dyslipidemia 12/28/2014  . Stimulant use disorder- cocaine 12/28/2014  . Severe recurrent major depression without psychotic features (HCC) 12/27/2014  . Diabetes (HCC) 12/27/2014  . Hypertension 12/27/2014    History reviewed. No pertinent past surgical history.  Current Outpatient Rx  Name  Route  Sig  Dispense  Refill  . allopurinol (ZYLOPRIM) 100 MG tablet   Oral   Take 1 tablet (100 mg total) by mouth daily.   7 tablet   0   . aspirin 81 MG chewable tablet   Oral   Chew 1 tablet (81 mg total) by mouth daily.         . citalopram (CELEXA)  20 MG tablet   Oral   Take 1 tablet (20 mg total) by mouth daily.   7 tablet   0   . colchicine 0.6 MG tablet   Oral   Take 1 tablet (0.6 mg total) by mouth 2 (two) times daily.   14 tablet   0   . diltiazem (CARDIZEM CD) 120 MG 24 hr capsule   Oral   Take 1 capsule (120 mg total) by mouth daily.   30 capsule   0   . glipiZIDE (GLUCOTROL) 5 MG tablet   Oral   Take 1 tablet (5 mg total) by mouth 2 (two) times daily before a meal.   30 tablet   0   . lisinopril (PRINIVIL,ZESTRIL) 5 MG tablet   Oral   Take 1 tablet (5 mg total) by mouth daily.   7 tablet   0   . metFORMIN (GLUCOPHAGE) 1000 MG tablet   Oral   Take 1 tablet (1,000 mg total) by mouth 2 (two) times daily with a meal.   14 tablet   0   . pravastatin (PRAVACHOL) 20 MG tablet   Oral   Take 1 tablet (20 mg total) by mouth daily at 6 PM.   30 tablet   0   . traMADol (ULTRAM) 50 MG tablet   Oral  Take 1 tablet (50 mg total) by mouth 3 (three) times daily.   15 tablet   0     Allergies Review of patient's allergies indicates no known allergies.  Family History  Problem Relation Age of Onset  . Lung cancer Mother     Social History Social History  Substance Use Topics  . Smoking status: Never Smoker   . Smokeless tobacco: Never Used  . Alcohol Use: Yes     Comment: Previously drank heavily.  Cut back in 2014 but still drinks on occassion.    Review of Systems  Constitutional: Negative for fever. Eyes: Negative for visual changes. ENT: Negative for sore throat. Cardiovascular: Positive for chest pain. Respiratory: Negative for shortness of breath. Gastrointestinal: Negative for abdominal pain, vomiting and diarrhea. Genitourinary: Negative for dysuria. Musculoskeletal: Negative for back pain. Skin: Negative for rash. Neurological: Negative for headaches, focal weakness or numbness.   10-point ROS otherwise negative.  ____________________________________________   PHYSICAL  EXAM:  VITAL SIGNS: ED Triage Vitals  Enc Vitals Group     BP 03/29/15 0001 134/84 mmHg     Pulse Rate 03/29/15 0001 90     Resp 03/29/15 0001 26     Temp 03/29/15 0001 98.3 F (36.8 C)     Temp Source 03/29/15 0001 Oral     SpO2 03/29/15 0001 98 %     Weight 03/29/15 0001 225 lb (102.059 kg)     Height 03/29/15 0001 6\' 2"  (1.88 m)     Head Cir --      Peak Flow --      Pain Score 03/29/15 0003 8     Pain Loc --      Pain Edu? --      Excl. in GC? --      Constitutional: Alert and oriented. Bizarre behavior. Eyes: Conjunctivae are normal. PERRL. Normal extraocular movements. ENT   Head: Normocephalic and atraumatic.   Nose: No congestion/rhinnorhea.   Mouth/Throat: Mucous membranes are moist.   Neck: No stridor. Hematological/Lymphatic/Immunilogical: No cervical lymphadenopathy. Cardiovascular: Normal rate, regular rhythm. Normal and symmetric distal pulses are present in all extremities. No murmurs, rubs, or gallops. Respiratory: Normal respiratory effort without tachypnea nor retractions. Breath sounds are clear and equal bilaterally. No wheezes/rales/rhonchi. Gastrointestinal: Soft and nontender. No distention. There is no CVA tenderness. Genitourinary: deferred Musculoskeletal: Nontender with normal range of motion in all extremities. No joint effusions.  No lower extremity tenderness nor edema. Neurologic:  Normal speech and language. No gross focal neurologic deficits are appreciated. Speech is normal.  Skin:  Skin is warm, dry and intact. No rash noted. Psychiatric: Mood and affect are normal. Speech and behavior are normal. Patient exhibits appropriate insight and judgment.  ____________________________________________    LABS (pertinent positives/negatives)  Labs Reviewed  BASIC METABOLIC PANEL - Abnormal; Notable for the following:    Glucose, Bld 147 (*)    Calcium 8.5 (*)    All other components within normal limits  CBC - Abnormal; Notable  for the following:    RDW 15.0 (*)    All other components within normal limits  ETHANOL - Abnormal; Notable for the following:    Alcohol, Ethyl (B) 348 (*)    All other components within normal limits  TROPONIN I  URINE DRUG SCREEN, QUALITATIVE ([facility] ONLY)     RADIOLOGY  DG Chest Port 1 View (Final result) Result time: 03/29/15 00:20:05   Final result by Rad Results In Interface (03/29/15 00:20:05)   Narrative:  CLINICAL DATA: Acute onset of substernal chest pain, radiating to the left shoulder. Initial encounter.  EXAM: PORTABLE CHEST 1 VIEW  COMPARISON: Chest radiograph performed 06/28/2014, and CTA of the chest performed 09/22/2014  FINDINGS: The lungs are well-aerated. Mild vascular congestion is noted. Mild left basilar atelectasis is seen. There is no evidence of pleural effusion or pneumothorax.  The cardiomediastinal silhouette is within normal limits. No acute osseous abnormalities are seen.  IMPRESSION: Mild vascular congestion noted. Mild left basilar atelectasis seen.   Electronically Signed By: Roanna Raider M.D. On: 03/29/2015 00:20       INITIAL IMPRESSION / ASSESSMENT AND PLAN / ED COURSE  Pertinent labs & imaging results that were available during my care of the patient were reviewed by me and considered in my medical decision making (see chart for details).   ____________________________________________   FINAL CLINICAL IMPRESSION(S) / ED DIAGNOSES  Final diagnoses:  Alcohol intoxication, uncomplicated (HCC)  Chest pain, unspecified chest pain type      Darci Current, MD 03/29/15 [phone]

## 2015-03-29 NOTE — Discharge Instructions (Signed)
Nonspecific Chest Pain  Chest pain can be caused by many different conditions. There is always a chance that your pain could be related to something serious, such as a heart attack or a blood clot in your lungs. Chest pain can also be caused by conditions that are not life-threatening. If you have chest pain, it is very important to follow up with your health care provider. CAUSES  Chest pain can be caused by:  Heartburn.  Pneumonia or bronchitis.  Anxiety or stress.  Inflammation around your heart (pericarditis) or lung (pleuritis or pleurisy).  A blood clot in your lung.  A collapsed lung (pneumothorax). It can develop suddenly on its own (spontaneous pneumothorax) or from trauma to the chest.  Shingles infection (varicella-zoster virus).  Heart attack.  Damage to the bones, muscles, and cartilage that make up your chest wall. This can include:  Bruised bones due to injury.  Strained muscles or cartilage due to frequent or repeated coughing or overwork.  Fracture to one or more ribs.  Sore cartilage due to inflammation (costochondritis). RISK FACTORS  Risk factors for chest pain may include:  Activities that increase your risk for trauma or injury to your chest.  Respiratory infections or conditions that cause frequent coughing.  Medical conditions or overeating that can cause heartburn.  Heart disease or family history of heart disease.  Conditions or health behaviors that increase your risk of developing a blood clot.  Having had chicken pox (varicella zoster). SIGNS AND SYMPTOMS Chest pain can feel like:  Burning or tingling on the surface of your chest or deep in your chest.  Crushing, pressure, aching, or squeezing pain.  Dull or sharp pain that is worse when you move, cough, or take a deep breath.  Pain that is also felt in your back, neck, shoulder, or arm, or pain that spreads to any of these areas. Your chest pain may come and go, or it may stay  constant. DIAGNOSIS Lab tests or other studies may be needed to find the cause of your pain. Your health care provider may have you take a test called an ambulatory ECG (electrocardiogram). An ECG records your heartbeat patterns at the time the test is performed. You may also have other tests, such as:  Transthoracic echocardiogram (TTE). During echocardiography, sound waves are used to create a picture of all of the heart structures and to look at how blood flows through your heart.  Transesophageal echocardiogram (TEE).This is a more advanced imaging test that obtains images from inside your body. It allows your health care provider to see your heart in finer detail.  Cardiac monitoring. This allows your health care provider to monitor your heart rate and rhythm in real time.  Holter monitor. This is a portable device that records your heartbeat and can help to diagnose abnormal heartbeats. It allows your health care provider to track your heart activity for several days, if needed.  Stress tests. These can be done through exercise or by taking medicine that makes your heart beat more quickly.  Blood tests.  Imaging tests. TREATMENT  Your treatment depends on what is causing your chest pain. Treatment may include:  Medicines. These may include:  Acid blockers for heartburn.  Anti-inflammatory medicine.  Pain medicine for inflammatory conditions.  Antibiotic medicine, if an infection is present.  Medicines to dissolve blood clots.  Medicines to treat coronary artery disease.  Supportive care for conditions that do not require medicines. This may include:  Resting.  Applying heat  or cold packs to injured areas.  Limiting activities until pain decreases. HOME CARE INSTRUCTIONS  If you were prescribed an antibiotic medicine, finish it all even if you start to feel better.  Avoid any activities that bring on chest pain.  Do not use any tobacco products, including  cigarettes, chewing tobacco, or electronic cigarettes. If you need help quitting, ask your health care provider.  Do not drink alcohol.  Take medicines only as directed by your health care provider.  Keep all follow-up visits as directed by your health care provider. This is important. This includes any further testing if your chest pain does not go away.  If heartburn is the cause for your chest pain, you may be told to keep your head raised (elevated) while sleeping. This reduces the chance that acid will go from your stomach into your esophagus.  Make lifestyle changes as directed by your health care provider. These may include:  Getting regular exercise. Ask your health care provider to suggest some activities that are safe for you.  Eating a heart-healthy diet. A registered dietitian can help you to learn healthy eating options.  Maintaining a healthy weight.  Managing diabetes, if necessary.  Reducing stress. SEEK MEDICAL CARE IF:  Your chest pain does not go away after treatment.  You have a rash with blisters on your chest.  You have a fever. SEEK IMMEDIATE MEDICAL CARE IF:   Your chest pain is worse.  You have an increasing cough, or you cough up blood.  You have severe abdominal pain.  You have severe weakness.  You faint.  You have chills.  You have sudden, unexplained chest discomfort.  You have sudden, unexplained discomfort in your arms, back, neck, or jaw.  You have shortness of breath at any time.  You suddenly start to sweat, or your skin gets clammy.  You feel nauseous or you vomit.  You suddenly feel light-headed or dizzy.  Your heart begins to beat quickly, or it feels like it is skipping beats. These symptoms may represent a serious problem that is an emergency. Do not wait to see if the symptoms will go away. Get medical help right away. Call your local emergency services (911 in the [place] Do not drive yourself to the hospital.   This  information is not intended to replace advice given to you by your health care provider. Make sure you discuss any questions you have with your health care provider.   Document Released: 12/24/2004 Document Revised: 04/06/2014 Document Reviewed: 10/20/2013 Elsevier Interactive Patient Education 2016 [employer].  Alcohol Intoxication Alcohol intoxication occurs when the amount of alcohol that a person has consumed impairs his or her ability to mentally and physically function. Alcohol directly impairs the normal chemical activity of the brain. Drinking large amounts of alcohol can lead to changes in mental function and behavior, and it can cause many physical effects that can be harmful.  Alcohol intoxication can range in severity from mild to very severe. Various factors can affect the level of intoxication that occurs, such as the person's age, gender, weight, frequency of alcohol consumption, and the presence of other medical conditions (such as diabetes, seizures, or heart conditions). Dangerous levels of alcohol intoxication may occur when people drink large amounts of alcohol in a short period (binge drinking). Alcohol can also be especially dangerous when combined with certain prescription medicines or "recreational" drugs. SIGNS AND SYMPTOMS Some common signs and symptoms of mild alcohol intoxication include:  Loss of coordination.  Changes in mood and behavior.  Impaired judgment.  Slurred speech. As alcohol intoxication progresses to more severe levels, other signs and symptoms will appear. These may include:  Vomiting.  Confusion and impaired memory.  Slowed breathing.  Seizures.  Loss of consciousness. DIAGNOSIS  Your health care provider will take a medical history and perform a physical exam. You will be asked about the amount and type of alcohol you have consumed. Blood tests will be done to measure the concentration of alcohol in your blood. In many places, your blood  alcohol level must be lower than 80 mg/dL (1.61%0.08%) to legally drive. However, many dangerous effects of alcohol can occur at much lower levels.  TREATMENT  People with alcohol intoxication often do not require treatment. Most of the effects of alcohol intoxication are temporary, and they go away as the alcohol naturally leaves the body. Your health care provider will monitor your condition until you are stable enough to go home. Fluids are sometimes given through an IV access tube to help prevent dehydration.  HOME CARE INSTRUCTIONS  Do not drive after drinking alcohol.  Stay hydrated. Drink enough water and fluids to keep your urine clear or pale yellow. Avoid caffeine.   Only take over-the-counter or prescription medicines as directed by your health care provider.  SEEK MEDICAL CARE IF:   You have persistent vomiting.   You do not feel better after a few days.  You have frequent alcohol intoxication. Your health care provider can help determine if you should see a substance use treatment counselor. SEEK IMMEDIATE MEDICAL CARE IF:   You become shaky or tremble when you try to stop drinking.   You shake uncontrollably (seizure).   You throw up (vomit) blood. This may be bright red or may look like black coffee grounds.   You have blood in your stool. This may be bright red or may appear as a black, tarry, bad smelling stool.   You become lightheaded or faint.  MAKE SURE YOU:   Understand these instructions.  Will watch your condition.  Will get help right away if you are not doing well or get worse.   This information is not intended to replace advice given to you by your health care provider. Make sure you discuss any questions you have with your health care provider.   Document Released: 12/24/2004 Document Revised: 11/16/2012 Document Reviewed: 08/19/2012 Elsevier Interactive Patient Education [employer]2016 Elsevier Inc.

## 2015-10-09 ENCOUNTER — Emergency Department
Admission: EM | Admit: 2015-10-09 | Discharge: 2015-10-09 | Disposition: A | Discharge status: Home / Self Care | Attending: Emergency Medicine | Admitting: Emergency Medicine

## 2015-10-09 ENCOUNTER — Observation Stay (HOSPITAL_COMMUNITY)
Admission: AD | Admit: 2015-10-09 | Payer: No Typology Code available for payment source | Source: Intra-hospital | Admitting: Psychiatry

## 2015-10-09 DIAGNOSIS — F329 Major depressive disorder, single episode, unspecified: Secondary | ICD-10-CM

## 2015-10-09 DIAGNOSIS — I2510 Atherosclerotic heart disease of native coronary artery without angina pectoris: Secondary | ICD-10-CM | POA: Insufficient documentation

## 2015-10-09 DIAGNOSIS — E119 Type 2 diabetes mellitus without complications: Secondary | ICD-10-CM | POA: Insufficient documentation

## 2015-10-09 DIAGNOSIS — Z7982 Long term (current) use of aspirin: Secondary | ICD-10-CM | POA: Insufficient documentation

## 2015-10-09 DIAGNOSIS — F10939 Alcohol use, unspecified with withdrawal, unspecified: Secondary | ICD-10-CM | POA: Diagnosis present

## 2015-10-09 DIAGNOSIS — F1020 Alcohol dependence, uncomplicated: Secondary | ICD-10-CM | POA: Diagnosis present

## 2015-10-09 DIAGNOSIS — Z7984 Long term (current) use of oral hypoglycemic drugs: Secondary | ICD-10-CM | POA: Insufficient documentation

## 2015-10-09 DIAGNOSIS — F32A Depression, unspecified: Secondary | ICD-10-CM

## 2015-10-09 DIAGNOSIS — F332 Major depressive disorder, recurrent severe without psychotic features: Secondary | ICD-10-CM | POA: Insufficient documentation

## 2015-10-09 DIAGNOSIS — Z794 Long term (current) use of insulin: Secondary | ICD-10-CM | POA: Insufficient documentation

## 2015-10-09 DIAGNOSIS — F1490 Cocaine use, unspecified, uncomplicated: Secondary | ICD-10-CM | POA: Insufficient documentation

## 2015-10-09 DIAGNOSIS — F1010 Alcohol abuse, uncomplicated: Secondary | ICD-10-CM

## 2015-10-09 DIAGNOSIS — I10 Essential (primary) hypertension: Secondary | ICD-10-CM | POA: Insufficient documentation

## 2015-10-09 DIAGNOSIS — Z79899 Other long term (current) drug therapy: Secondary | ICD-10-CM | POA: Insufficient documentation

## 2015-10-09 DIAGNOSIS — F1994 Other psychoactive substance use, unspecified with psychoactive substance-induced mood disorder: Secondary | ICD-10-CM

## 2015-10-09 DIAGNOSIS — E785 Hyperlipidemia, unspecified: Secondary | ICD-10-CM | POA: Insufficient documentation

## 2015-10-09 DIAGNOSIS — I480 Paroxysmal atrial fibrillation: Secondary | ICD-10-CM | POA: Insufficient documentation

## 2015-10-09 DIAGNOSIS — F10239 Alcohol dependence with withdrawal, unspecified: Secondary | ICD-10-CM | POA: Diagnosis present

## 2015-10-09 LAB — GLUCOSE, CAPILLARY: GLUCOSE-CAPILLARY: 162 mg/dL — AB (ref 65–99)

## 2015-10-09 LAB — CBC
HEMATOCRIT: 40.2 % (ref 40.0–52.0)
HEMOGLOBIN: 14.2 g/dL (ref 13.0–18.0)
MCH: 30.3 pg (ref 26.0–34.0)
MCHC: 35.3 g/dL (ref 32.0–36.0)
MCV: 85.7 fL (ref 80.0–100.0)
Platelets: 238 10*3/uL (ref 150–440)
RBC: 4.69 MIL/uL (ref 4.40–5.90)
RDW: 15.9 % — AB (ref 11.5–14.5)
WBC: 5.2 10*3/uL (ref 3.8–10.6)

## 2015-10-09 LAB — COMPREHENSIVE METABOLIC PANEL
ALBUMIN: 4.3 g/dL (ref 3.5–5.0)
ALT: 21 U/L (ref 17–63)
AST: 22 U/L (ref 15–41)
Alkaline Phosphatase: 45 U/L (ref 38–126)
Anion gap: 11 (ref 5–15)
BILIRUBIN TOTAL: 0.7 mg/dL (ref 0.3–1.2)
BUN: 6 mg/dL (ref 6–20)
CHLORIDE: 108 mmol/L (ref 101–111)
CO2: 21 mmol/L — ABNORMAL LOW (ref 22–32)
Calcium: 8.5 mg/dL — ABNORMAL LOW (ref 8.9–10.3)
Creatinine, Ser: 0.89 mg/dL (ref 0.61–1.24)
GFR calc Af Amer: >60 mL/min (ref >60)
GFR calc non Af Amer: >60 mL/min (ref >60)
GLUCOSE: 146 mg/dL — AB (ref 65–99)
POTASSIUM: 3.7 mmol/L (ref 3.5–5.1)
Sodium: 140 mmol/L (ref 135–145)
TOTAL PROTEIN: 7.3 g/dL (ref 6.5–8.1)

## 2015-10-09 LAB — ETHANOL: Alcohol, Ethyl (B): 318 mg/dL (ref <5)

## 2015-10-09 MED ORDER — GABAPENTIN 600 MG PO TABS
300.0000 mg | ORAL_TABLET | Freq: Three times a day (TID) | ORAL | Status: DC
  Administered 2015-10-09 (×2): 300 mg via ORAL
  Filled 2015-10-09 (×2): qty 1

## 2015-10-09 MED ORDER — ASPIRIN EC 81 MG PO TBEC
81.0000 mg | DELAYED_RELEASE_TABLET | Freq: Every day | ORAL | Status: DC
  Administered 2015-10-09: 81 mg via ORAL
  Filled 2015-10-09: qty 1

## 2015-10-09 MED ORDER — METOPROLOL SUCCINATE ER 50 MG PO TB24
25.0000 mg | ORAL_TABLET | Freq: Every day | ORAL | Status: DC
  Administered 2015-10-09: 25 mg via ORAL
  Filled 2015-10-09: qty 1

## 2015-10-09 MED ORDER — INSULIN GLARGINE 100 UNIT/ML ~~LOC~~ SOLN
10.0000 [IU] | Freq: Every day | SUBCUTANEOUS | Status: DC
  Administered 2015-10-09: 10 [IU] via SUBCUTANEOUS
  Filled 2015-10-09 (×3): qty 0.1

## 2015-10-09 MED ORDER — FAMOTIDINE 20 MG PO TABS
20.0000 mg | ORAL_TABLET | Freq: Two times a day (BID) | ORAL | Status: DC
  Administered 2015-10-09: 20 mg via ORAL
  Filled 2015-10-09: qty 1

## 2015-10-09 MED ORDER — FAMOTIDINE 20 MG PO TABS: 20.0000 mg | ORAL_TABLET | Freq: Every day | ORAL | Status: DC

## 2015-10-09 MED ORDER — LORAZEPAM 2 MG PO TABS
0.0000 mg | ORAL_TABLET | Freq: Four times a day (QID) | ORAL | Status: DC
  Administered 2015-10-09: 1 mg via ORAL
  Administered 2015-10-09: 2 mg via ORAL
  Filled 2015-10-09 (×2): qty 1

## 2015-10-09 MED ORDER — LORAZEPAM 2 MG PO TABS: 0.0000 mg | ORAL_TABLET | Freq: Two times a day (BID) | ORAL | Status: DC

## 2015-10-09 MED ORDER — LISINOPRIL 5 MG PO TABS
5.0000 mg | ORAL_TABLET | Freq: Every day | ORAL | Status: DC
  Administered 2015-10-09: 5 mg via ORAL
  Filled 2015-10-09: qty 1

## 2015-10-09 MED ORDER — METFORMIN HCL 500 MG PO TABS
1000.0000 mg | ORAL_TABLET | Freq: Two times a day (BID) | ORAL | Status: DC
  Administered 2015-10-09 (×2): 1000 mg via ORAL
  Filled 2015-10-09 (×2): qty 2

## 2015-10-09 NOTE — ED Notes (Signed)
Pt stating he wants to leave. When asked why, pt states "I don't know, I just do". Pt reports he thinks he is going through alcohol withdrawal. Pt states last drink was last night. Pt denies current suicidal ideation.

## 2015-10-09 NOTE — Progress Notes (Signed)
Per Pt nurse, pt declined admission to [facility] states he will go to [facility] tomorrow.  Tennile Styles K. Sherlon HandingHarris, LCAS-A, LPC-A, [facility]  Counselor 10/09/2015 4:58 PM

## 2015-10-09 NOTE — Progress Notes (Signed)
[facility] to [facility] bed [facility]. Marlana Mckowen K. Sherlon HandingHarris, LCAS-A, LPC-A, [facility]  Counselor 10/09/2015 4:08 PM

## 2015-10-09 NOTE — ED Notes (Signed)
Pt given belongings at time of discharge.  

## 2015-10-09 NOTE — ED Provider Notes (Signed)
[facility] Emergency Department Provider Note   I have reviewed the triage vital signs and the nursing notes.   HISTORY  Chief Complaint Suicidal   History limited by: Not Limited   HPI [name] is a 50 y.o. male resents to the emergency department today because of concerns for depression and desire to no longer be alive. Patient states that the symptoms have been going on for the past few months. It all started when the patient had a significant trauma and essentially lost all strength in his lower extremities. As he has started to regain some mobility however his girlfriend left him. This is added to his stress. He has been abusing alcohol as well. He states that that has been going on for a long time. Denies any history of depression prior to his fall. He did not express a plan to me. He denies any medical complaints.   Past Medical History  Diagnosis Date  . CAD (coronary artery disease)     a. [place] ->Cath reportedly nl.  Marland Kitchen. HTN (hypertension)   . Hyperlipidemia   . Borderline diabetes   . Cocaine abuse   . History of ETOH abuse     a. Cut back beginning in 2014.  Marland Kitchen. PAF (paroxysmal atrial fibrillation) (HCC)     a. Dx in 2007->s/p DCCV x 2 (last "a few yrs ago");  b. Never anticoagulated (CHA2DS2VASc = 1).  . Obesity   . Diabetes mellitus without complication [facility](HCC)     Patient Active Problem List   Diagnosis Date Noted  . Gout 12/31/2014  . Atrial fibrillation (HCC) 12/31/2014  . Alcohol use disorder, severe, dependence (HCC) 12/28/2014  . Alcohol withdrawal (HCC) 12/28/2014  . Dyslipidemia 12/28/2014  . Stimulant use disorder- cocaine 12/28/2014  . Severe recurrent major depression without psychotic features (HCC) 12/27/2014  . Diabetes (HCC) 12/27/2014  . Hypertension 12/27/2014    No past surgical history on file.  Current Outpatient Rx  Name  Route  Sig  Dispense  Refill  . allopurinol (ZYLOPRIM) 100 MG  tablet   Oral   Take 1 tablet (100 mg total) by mouth daily.   7 tablet   0   . aspirin 81 MG chewable tablet   Oral   Chew 1 tablet (81 mg total) by mouth daily.         . citalopram (CELEXA) 20 MG tablet   Oral   Take 1 tablet (20 mg total) by mouth daily.   7 tablet   0   . colchicine 0.6 MG tablet   Oral   Take 1 tablet (0.6 mg total) by mouth 2 (two) times daily.   14 tablet   0   . diltiazem (CARDIZEM CD) 120 MG 24 hr capsule   Oral   Take 1 capsule (120 mg total) by mouth daily.   30 capsule   0   . glipiZIDE (GLUCOTROL) 5 MG tablet   Oral   Take 1 tablet (5 mg total) by mouth 2 (two) times daily before a meal.   30 tablet   0   . lisinopril (PRINIVIL,ZESTRIL) 5 MG tablet   Oral   Take 1 tablet (5 mg total) by mouth daily.   7 tablet   0   . metFORMIN (GLUCOPHAGE) 1000 MG tablet   Oral   Take 1 tablet (1,000 mg total) by mouth 2 (two) times daily with a meal.   14 tablet   0   .  pravastatin (PRAVACHOL) 20 MG tablet   Oral   Take 1 tablet (20 mg total) by mouth daily at 6 PM.   30 tablet   0   . traMADol (ULTRAM) 50 MG tablet   Oral   Take 1 tablet (50 mg total) by mouth 3 (three) times daily.   15 tablet   0     Allergies Review of patient's allergies indicates no known allergies.  Family History  Problem Relation Age of Onset  . Lung cancer Mother     Social History Social History  Substance Use Topics  . Smoking status: Never Smoker   . Smokeless tobacco: Never Used  . Alcohol Use: Yes     Comment: Previously drank heavily.  Cut back in 2014 but still drinks on occassion.    Review of Systems  Constitutional: Negative for fever. Cardiovascular: Negative for chest pain. Respiratory: Negative for shortness of breath. Gastrointestinal: Negative for abdominal pain, vomiting and diarrhea. Genitourinary: Negative for dysuria. Musculoskeletal: Negative for back pain. Skin: Negative for rash. Neurological: Negative for  headaches, focal weakness or numbness.  10-point ROS otherwise negative.  ____________________________________________   PHYSICAL EXAM:  VITAL SIGNS: ED Triage Vitals  Enc Vitals Group     BP 10/09/15 0024 114/82 mmHg     Pulse Rate 10/09/15 0024 95     Resp 10/09/15 0024 18     Temp 10/09/15 0024 98.5 F (36.9 C)     Temp Source 10/09/15 0024 Oral     SpO2 10/09/15 0024 95 %     Weight 10/09/15 0024 240 lb (108.863 kg)     Height 10/09/15 0024 6' (1.829 m)     Head Cir --      Peak Flow --      Pain Score 10/09/15 0233 0   Constitutional: Alert and oriented. Appears slightly depressed Eyes: Conjunctivae are normal. PERRL. Normal extraocular movements. ENT   Head: Normocephalic and atraumatic.   Nose: No congestion/rhinnorhea.   Mouth/Throat: Mucous membranes are moist.   Neck: No stridor. Hematological/Lymphatic/Immunilogical: No cervical lymphadenopathy. Cardiovascular: Normal rate, regular rhythm.  No murmurs, rubs, or gallops. Respiratory: Normal respiratory effort without tachypnea nor retractions. Breath sounds are clear and equal bilaterally. No wheezes/rales/rhonchi. Gastrointestinal: Soft and nontender. No distention.  Genitourinary: Deferred Musculoskeletal: Normal range of motion in all extremities. No joint effusions.  No lower extremity tenderness nor edema. Neurologic:  Normal speech and language. No gross focal neurologic deficits are appreciated.  Skin:  Skin is warm, dry and intact. No rash noted. Psychiatric: Appears depressed, endorses suicidal ideation  ____________________________________________    LABS (pertinent positives/negatives)  Labs Reviewed  CBC - Abnormal; Notable for the following:    RDW 15.9 (*)    All other components within normal limits  COMPREHENSIVE METABOLIC PANEL - Abnormal; Notable for the following:    CO2 21 (*)    Glucose, Bld 146 (*)    Calcium 8.5 (*)    All other components within normal limits   ETHANOL - Abnormal; Notable for the following:    Alcohol, Ethyl (B) 318 (*)    All other components within normal limits  URINALYSIS COMPLETEWITH MICROSCOPIC ([facility] ONLY)  URINE DRUG SCREEN, QUALITATIVE ([facility] ONLY)     ____________________________________________   EKG  None  ____________________________________________    RADIOLOGY  None  ____________________________________________   PROCEDURES  Procedure(s) performed: None  Critical Care performed: No  ____________________________________________   INITIAL IMPRESSION / ASSESSMENT AND PLAN / ED COURSE  Pertinent labs &  imaging results that were available during my care of the patient were reviewed by me and considered in my medical decision making (see chart for details).  Patient presented to the emergency department today because of concerns for depression and suicidal ideation. On exam patient does appear depressed. He is willing to stay and speak to psychiatry. The patient additionally has history of alcohol abuse and alcohol level blood work was quite elevated. Will consult psychiatry.  ____________________________________________   FINAL CLINICAL IMPRESSION(S) / ED DIAGNOSES  Final diagnoses:  Alcohol abuse  Depression     Note: This dictation was prepared with Dragon dictation. Any transcriptional errors that result from this process are unintentional    Phineas Semen, MD 10/09/15 0600

## 2015-10-09 NOTE — ED Notes (Signed)
Pt in with depression and suicidal thoughts, pt is intoxicated. States had fall 2 months that left him with decreased mobility and girlfriend left him.

## 2015-10-09 NOTE — Consult Note (Signed)
[facility] Face-to-Face Psychiatry Consult   Reason for Consult:  Consult for 50 year old man with alcohol abuse presented requesting detox. Referring Physician:  Edd Fabian Patient Identification: [name] MRN:  [id number] Principal Diagnosis: Substance induced mood disorder [facility]) Diagnosis:   Patient Active Problem List   Diagnosis Date Noted  . Substance induced mood disorder ([place]) [F19.94] 10/09/2015  . Gout [M10.9] 12/31/2014  . Atrial fibrillation ([place]) [I48.91] 12/31/2014  . Alcohol use disorder, severe, dependence ([place]) [F10.20] 12/28/2014  . Alcohol withdrawal ([place]) [F10.239] 12/28/2014  . Dyslipidemia [E78.5] 12/28/2014  . Stimulant use disorder- cocaine [F15.90] 12/28/2014  . Severe recurrent major depression without psychotic features ([place]) [F33.2] 12/27/2014  . Diabetes ([place]) [E11.9] 12/27/2014  . Hypertension [I10] 12/27/2014    Total Time spent with patient: 1 hour  Subjective:   [name] is a 50 y.o. male patient admitted with "I need to stop drinking".  HPI:  Patient presented voluntarily to the emergency room. States that he has been drinking heavily about 18-20 beers per day. Last drink was yesterday. Has been going at it this rate for months. Mood has been getting worse. Last night he had vague passive thoughts about hurting himself but with no specific plan and did not act on it. Sleep has been poor. Appetite is been poor. Today he is feeling somewhat sick to his stomach. He hasn't been taking care of his health well. He and his girlfriend are having conflict over his drinking which has worsened his mood. Not currently involved in any outpatient substance abuse or mental health treatment.  Social history: Not currently working. Lives with his girlfriend.  Medical history: Multiple medical problems including diabetes with insulin dependence, hypertension, history of atrial fibrillation, gout, diabetic neuropathy in his feet  Substance abuse history: History of  recurrent alcohol abuse. Has been hospitalized for detox in the past. No history of seizures or delirium tremens.  Past Psychiatric History: Patient has presented with depression in the past in the context of substance abuse. Never followed up with any outpatient treatment. No history of suicide attempts or violence. Only past hospitalization was regarding substance abuse.  Risk to Self: Suicidal Ideation: Yes-Currently Present Suicidal Intent: Yes-Currently Present (contemplating plan unspecified) Is patient at risk for suicide?: Yes Suicidal Plan?: No Access to Means: No What has been your use of drugs/alcohol within the last 12 months?: cociane, alcohol How many times?: 0 Other Self Harm Risks: none noted Triggers for Past Attempts: None known Intentional Self Injurious Behavior: None Risk to Others: Homicidal Ideation: No Thoughts of Harm to Others: No Current Homicidal Intent: No Current Homicidal Plan: No Access to Homicidal Means: No Identified Victim: None History of harm to others?: No Assessment of Violence: None Noted Violent Behavior Description: N/A Does patient have access to weapons?: No Criminal Charges Pending?: No Does patient have a court date: No Prior Inpatient Therapy: Prior Inpatient Therapy: Yes Prior Therapy Dates: 2016 Prior Therapy Facilty/Provider(s): [facility] Reason for Treatment: depression/ Alcohol Prior Outpatient Therapy: Prior Outpatient Therapy: No Prior Therapy Dates: Na Prior Therapy Facilty/Provider(s): none given Reason for Treatment: n/a Does patient have an ACCT team?: No Does patient have Intensive In-House Services?  : No Does patient have [facility] services? : No Does patient have P4CC services?: No  Past Medical History:  Past Medical History  Diagnosis Date  . CAD (coronary artery disease)     a. 2007 MI in [place], [place] ->Cath reportedly nl.  Marland Kitchen HTN (hypertension)   . Hyperlipidemia   .  Borderline diabetes   . Cocaine abuse   .  History of ETOH abuse     a. Cut back beginning in 2014.  Marland Kitchen PAF (paroxysmal atrial fibrillation) ([place])     a. Dx in 2007->s/p DCCV x 2 (last "a few yrs ago");  b. Never anticoagulated (CHA2DS2VASc = 1).  . Obesity   . Diabetes mellitus without complication ([place])    No past surgical history on file. Family History:  Family History  Problem Relation Age of Onset  . Lung cancer Mother    Family Psychiatric  History: Patient believes there are multiple members of his family with alcohol abuse problems Social History:  History  Alcohol Use  . Yes    Comment: Previously drank heavily.  Cut back in 2014 but still drinks on occassion.     History  Drug Use No    Comment: Habitual crack cocaine usage.    Social History   Social History  . Marital Status: Single    Spouse Name: N/A  . Number of Children: N/A  . Years of Education: N/A   Social History Main Topics  . Smoking status: Never Smoker   . Smokeless tobacco: Never Used  . Alcohol Use: Yes     Comment: Previously drank heavily.  Cut back in 2014 but still drinks on occassion.  . Drug Use: No     Comment: Habitual crack cocaine usage.  Marland Kitchen Sexual Activity: No   Other Topics Concern  . Not on file   Social History Narrative   The patient says his dad is deceased but his mother still living. 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 He has filed for disability. He is currently divorced and has 2 children. He is living with his cousin.      He does report multiple legal charges related to DUI. He was also driving without a valid driver's license more recently and cannot drive.      Additional Social History:    Allergies:  No Known Allergies  Labs:  Results for orders placed or performed during the hospital encounter of 10/09/15 (from the past 48 hour(s))  CBC     Status: Abnormal   Collection Time: 10/09/15 12:30 AM  Result Value Ref Range   WBC 5.2 3.8 - 10.6 K/uL    RBC 4.69 4.40 - 5.90 MIL/uL   Hemoglobin 14.2 13.0 - 18.0 g/dL   HCT 40.2 40.0 - 52.0 %   MCV 85.7 80.0 - 100.0 fL   MCH 30.3 26.0 - 34.0 pg   MCHC 35.3 32.0 - 36.0 g/dL   RDW 15.9 (H) 11.5 - 14.5 %   Platelets 238 150 - 440 K/uL  Comprehensive metabolic panel     Status: Abnormal   Collection Time: 10/09/15 12:30 AM  Result Value Ref Range   Sodium 140 135 - 145 mmol/L   Potassium 3.7 3.5 - 5.1 mmol/L   Chloride 108 101 - 111 mmol/L   CO2 21 (L) 22 - 32 mmol/L   Glucose, Bld 146 (H) 65 - 99 mg/dL   BUN 6 6 - 20 mg/dL   Creatinine, Ser 0.89 0.61 - 1.24 mg/dL   Calcium 8.5 (L) 8.9 - 10.3 mg/dL   Total Protein 7.3 6.5 - 8.1 g/dL   Albumin 4.3 3.5 - 5.0 g/dL   AST 22 15 - 41 U/L   ALT 21 17 - 63 U/L   Alkaline Phosphatase 45 38 -  126 U/L   Total Bilirubin 0.7 0.3 - 1.2 mg/dL   GFR calc non Af Amer >60 >60 mL/min   GFR calc Af Amer >60 >60 mL/min    Comment: (NOTE) The eGFR has been calculated using the CKD EPI equation. This calculation has not been validated in all clinical situations. eGFR's persistently <60 mL/min signify possible Chronic Kidney Disease.    Anion gap 11 5 - 15  Ethanol     Status: Abnormal   Collection Time: 10/09/15 12:30 AM  Result Value Ref Range   Alcohol, Ethyl (B) 318 (HH) <5 mg/dL    Comment: CRITICAL RESULT CALLED TO, READ BACK BY AND VERIFIED WITH BUTCH WOODS ON 10/09/15 AT 0148 BY TLB        LOWEST DETECTABLE LIMIT FOR SERUM ALCOHOL IS 5 mg/dL FOR MEDICAL PURPOSES ONLY   Glucose, capillary     Status: Abnormal   Collection Time: 10/09/15  9:03 AM  Result Value Ref Range   Glucose-Capillary 162 (H) 65 - 99 mg/dL    Current Facility-Administered Medications  Medication Dose Route Frequency Provider Last Rate Last Dose  . aspirin EC tablet 81 mg  81 mg Oral Daily Nance Pear, MD   81 mg at 10/09/15 0900  . famotidine (PEPCID) tablet 20 mg  20 mg Oral BID Nance Pear, MD   20 mg at 10/09/15 0859  . gabapentin (NEURONTIN) tablet 300  mg  300 mg Oral TID Nance Pear, MD   300 mg at 10/09/15 0900  . insulin glargine (LANTUS) injection 10 Units  10 Units Subcutaneous Daily Nance Pear, MD   10 Units at 10/09/15 1059  . lisinopril (PRINIVIL,ZESTRIL) tablet 5 mg  5 mg Oral Daily Nance Pear, MD   5 mg at 10/09/15 0900  . LORazepam (ATIVAN) tablet 0-4 mg  0-4 mg Oral Q6H Joanne Gavel, MD   1 mg at 10/09/15 1251  . metFORMIN (GLUCOPHAGE) tablet 1,000 mg  1,000 mg Oral BID WC Nance Pear, MD   1,000 mg at 10/09/15 0859  . metoprolol succinate (TOPROL-XL) 24 hr tablet 25 mg  25 mg Oral Daily Nance Pear, MD   25 mg at 10/09/15 1100   Current Outpatient Prescriptions  Medication Sig Dispense Refill  . aspirin 81 MG chewable tablet Chew 1 tablet (81 mg total) by mouth daily.    . famotidine (PEPCID) 20 MG tablet Take 20 mg by mouth 2 (two) times daily.    Marland Kitchen gabapentin (NEURONTIN) 300 MG capsule Take 300 mg by mouth 3 (three) times daily.    . insulin glargine (LANTUS) 100 UNIT/ML injection Inject 10 Units into the skin daily.    Marland Kitchen lisinopril (PRINIVIL,ZESTRIL) 5 MG tablet Take 1 tablet (5 mg total) by mouth daily. 7 tablet 0  . Melatonin 3 MG TABS Take 3 mg by mouth at bedtime as needed.    . metFORMIN (GLUCOPHAGE) 1000 MG tablet Take 1 tablet (1,000 mg total) by mouth 2 (two) times daily with a meal. 14 tablet 0  . metoprolol succinate (TOPROL-XL) 25 MG 24 hr tablet Take 25 mg by mouth daily.    Marland Kitchen tiZANidine (ZANAFLEX) 2 MG tablet Take 2 mg by mouth every 6 (six) hours as needed.      Musculoskeletal: Strength & Muscle Tone: within normal limits Gait & Station: normal Patient leans: N/A  Psychiatric Specialty Exam: Physical Exam  Nursing note and vitals reviewed. Constitutional: He appears well-developed and well-nourished.  HENT:  Head: Normocephalic and atraumatic.  Eyes:  Conjunctivae are normal. Pupils are equal, round, and reactive to light.  Neck: Normal range of motion.  Cardiovascular: Regular  rhythm and normal heart sounds.   Respiratory: Effort normal. No respiratory distress.  GI: Soft.  Musculoskeletal: Normal range of motion.  Neurological: He is alert.  Skin: Skin is warm and dry.  Psychiatric: Judgment normal. His mood appears anxious. His speech is delayed. He is slowed. Cognition and memory are normal. He exhibits a depressed mood. He expresses no suicidal plans.    Review of Systems  Constitutional: Negative.   HENT: Negative.   Eyes: Negative.   Respiratory: Negative.   Cardiovascular: Negative.   Gastrointestinal: Positive for nausea.  Musculoskeletal: Negative.   Skin: Negative.   Neurological: Positive for tremors.  Psychiatric/Behavioral: Positive for depression and substance abuse. Negative for suicidal ideas, hallucinations and memory loss. The patient is nervous/anxious and has insomnia.     Blood pressure 136/91, pulse 83, temperature 98.5 F (36.9 C), temperature source Oral, resp. rate 18, height 6' (1.829 m), weight 108.863 kg (240 lb), SpO2 95 %.Body mass index is 32.54 kg/(m^2).  General Appearance: Disheveled  Eye Contact:  Fair  Speech:  Slow  Volume:  Decreased  Mood:  Depressed  Affect:  Constricted  Thought Process:  Goal Directed  Orientation:  Full (Time, Place, and Person)  Thought Content:  Logical  Suicidal Thoughts:  No  Homicidal Thoughts:  No  Memory:  Immediate;   Good Recent;   Fair Remote;   Fair  Judgement:  Fair  Insight:  Fair  Psychomotor Activity:  Decreased  Concentration:  Concentration: Fair  Recall:  [employer] of Knowledge:  Fair  Language:  Fair  Akathisia:  No  Handed:  Right  AIMS (if indicated):     Assets:  Communication Skills Desire for Improvement Resilience  ADL's:  Intact  Cognition:  WNL  Sleep:        Treatment Plan Summary: Medication management and Plan 50 year old man with alcohol abuse and substance related depression. Currently not having any suicidal intent or plan. No psychosis.  Tremulous mild withdrawal symptoms. Seeking detox. Patient does not require inpatient psychiatric treatment. I recommend that we refer him to observation or our TS for brief detox. Patient is agreeable to the plan. Continue current medicines including medicines for his diabetes and heart disease.  Disposition: Patient does not meet criteria for psychiatric inpatient admission. Supportive therapy provided about ongoing stressors.  Alethia Berthold, MD 10/09/2015 3:27 PM

## 2015-10-09 NOTE — BH Assessment (Signed)
Tele Assessment Note   [name] is an 50 y.o. male, Caucasian, single who presents to [facility] after calling EMS with thoughts of not wanting to live anymore. Patient states that primary concern is of depression and SI. Patient states that , " I don't want to live no more", "I am a waste of space." Patient states that he has had a decline in sleep with 3- 4 hours per night, and has gone through recent break up with girlfriend who left him. Patient states that he now lives alone and has no support network or contacts, and denies hx of psychotic symptoms.  Patient acknowledges current SI with contemplation of plans, but none specified. Patient acknowledges hx. Of SI but reports no past attempts. Patient denies hx. Or current AVH and HI. Patient acknowledges hx. Of substance abuse with alcohol daily unspecified amounts since the age of 89 with last use on 10-08-15 for unknown amounts. Patient states that he has hx. Of cocaine use with last use 1 year ago for unspecified amounts since the age of [age]. Patient denies current or past outpatient treatment for psychiatric care or S.A., but acknowledges hx. Of inpatient psychiatric care for depression , SI and substance abuse.   Patient is dressed in scrubs and is alert and oriented x4. Patient speech was within normal limits and motor behavior appeared normal. Patient thought process is coherent. Patient  does not appear to be responding to internal stimuli. Patient was cooperative throughout the assessment.   Diagnosis: Major Depressive Disorder;  Alcohol Use Disorder, Severe  Past Medical History:  Past Medical History  Diagnosis Date  . CAD (coronary artery disease)     a. 2007 MI in [place], [place] ->Cath reportedly nl.  Marland Kitchen HTN (hypertension)   . Hyperlipidemia   . Borderline diabetes   . Cocaine abuse   . History of ETOH abuse     a. Cut back beginning in 2014.  Marland Kitchen PAF (paroxysmal atrial fibrillation) (HCC)     a. Dx in 2007->s/p DCCV x 2  (last "a few yrs ago");  b. Never anticoagulated (CHA2DS2VASc = 1).  . Obesity   . Diabetes mellitus without complication (HCC)     No past surgical history on file.  Family History:  Family History  Problem Relation Age of Onset  . Lung cancer Mother     Social History:  reports that he has never smoked. He has never used smokeless tobacco. He reports that he drinks alcohol. He reports that he does not use illicit drugs.  Additional Social History:  Alcohol / Drug Use Pain Medications: SEE MAR Prescriptions: SEE MAR Over the Counter: SEE MAR History of alcohol / drug use?: Yes Longest period of sobriety (when/how long): Cocaine, 1 year to date, alcohol, 4 months unknown time  Negative Consequences of Use: Financial, Personal relationships, Work / Programmer, multimedia Withdrawal Symptoms: Patient aware of relationship between substance abuse and physical/medical complications Substance #1 Name of Substance 1: Alcohol 1 - Age of First Use: 19 1 - Amount (size/oz): various/ unknown 1 - Frequency: daily 1 - Duration: years 1 - Last Use / Amount: 10-07-15 unknown amount Substance #2 Name of Substance 2: cocaine 2 - Age of First Use: [age] 2 - Amount (size/oz): various 2 - Frequency: varies 2 - Duration: years 2 - Last Use / Amount: 1 year ago   CIWA: CIWA-Ar BP: (!) 136/91 mmHg Pulse Rate: 83 COWS:    PATIENT STRENGTHS: (choose at least two) Active sense of humor  Average or above average intelligence Capable of independent living Communication skills  Allergies: No Known Allergies  Home Medications:  (Not in a hospital admission)  OB/GYN Status:  No LMP for male patient.  General Assessment Data Location of Assessment: [facility] ED TTS Assessment: In system Is this a Tele or Face-to-Face Assessment?: Face-to-Face Is this an Initial Assessment or a Re-assessment for this encounter?: Initial Assessment Marital status: Single Maiden name:  (n/a) Is patient pregnant?: No Pregnancy  Status: No Living Arrangements: Alone Can pt return to current living arrangement?: Yes Admission Status: Voluntary Is patient capable of signing voluntary admission?: Yes Referral Source: Self/Family/Friend Insurance type: SP  Medical Screening Exam [facility] Walk-in ONLY) Medical Exam completed: Yes  Crisis Care Plan Living Arrangements: Alone Name of Psychiatrist: none Name of Therapist: none  Education Status Is patient currently in school?: No Current Grade: n/a Highest grade of school patient has completed: some college Name of school: n/a Contact person: none given  Risk to self with the past 6 months Suicidal Ideation: Yes-Currently Present Has patient been a risk to self within the past 6 months prior to admission? : No Suicidal Intent: Yes-Currently Present (contemplating plan unspecified) Has patient had any suicidal intent within the past 6 months prior to admission? : No Is patient at risk for suicide?: Yes Suicidal Plan?: No Has patient had any suicidal plan within the past 6 months prior to admission? : No Access to Means: No What has been your use of drugs/alcohol within the last 12 months?: cociane, alcohol Previous Attempts/Gestures: No How many times?: 0 Other Self Harm Risks: none noted Triggers for Past Attempts: None known Intentional Self Injurious Behavior: None Family Suicide History: No Recent stressful life event(s): Recent negative physical changes Persecutory voices/beliefs?: No Depression: Yes Depression Symptoms: Despondent, Insomnia, Tearfulness, Isolating, Fatigue, Guilt, Loss of interest in usual pleasures, Feeling worthless/self pity, Feeling angry/irritable Substance abuse history and/or treatment for substance abuse?: Yes Suicide prevention information given to non-admitted patients: Yes  Risk to Others within the past 6 months Homicidal Ideation: No Does patient have any lifetime risk of violence toward others beyond the six months  prior to admission? : No Thoughts of Harm to Others: No Current Homicidal Intent: No Current Homicidal Plan: No Access to Homicidal Means: No Identified Victim: None History of harm to others?: No Assessment of Violence: None Noted Violent Behavior Description: N/A Does patient have access to weapons?: No Criminal Charges Pending?: No Does patient have a court date: No Is patient on probation?: No  Psychosis Hallucinations: None noted Delusions: None noted  Mental Status Report Appearance/Hygiene: In scrubs Eye Contact: Fair Motor Activity: Unremarkable Speech: Unremarkable Level of Consciousness: Alert, Drowsy Mood: Depressed, Ambivalent, Apprehensive, Ashamed/humiliated, Despair Affect: Depressed Anxiety Level: Moderate Thought Processes: Coherent, Relevant Judgement: Partial Orientation: Person, Place, Time, Situation, Appropriate for developmental age Obsessive Compulsive Thoughts/Behaviors: None  Cognitive Functioning Concentration: Normal Memory: Recent Intact, Remote Intact IQ: Average Insight: Fair Impulse Control: Poor Appetite: Fair Weight Loss: 0 Weight Gain: 0 Sleep: Decreased Total Hours of Sleep: 4 Vegetative Symptoms: None  ADLScreening [facility] Assessment Services) Patient's cognitive ability adequate to safely complete daily activities?: Yes Patient able to express need for assistance with ADLs?: Yes Independently performs ADLs?: Yes (appropriate for developmental age)  Prior Inpatient Therapy Prior Inpatient Therapy: Yes Prior Therapy Dates: 2016 Prior Therapy Facilty/Provider(s): [facility] Reason for Treatment: depression/ Alcohol  Prior Outpatient Therapy Prior Outpatient Therapy: No Prior Therapy Dates: Na Prior Therapy Facilty/Provider(s): none given Reason for Treatment: n/a  Does patient have an ACCT team?: No Does patient have Intensive In-House Services?  : No Does patient have [facility] services? : No Does patient have P4CC services?:  No  ADL Screening (condition at time of admission) Patient's cognitive ability adequate to safely complete daily activities?: Yes Is the patient deaf or have difficulty hearing?: No Does the patient have difficulty seeing, even when wearing glasses/contacts?: No Does the patient have difficulty concentrating, remembering, or making decisions?: No Patient able to express need for assistance with ADLs?: Yes Does the patient have difficulty dressing or bathing?: No Independently performs ADLs?: Yes (appropriate for developmental age) Does the patient have difficulty walking or climbing stairs?: Yes (recovering from past problem with legs) Weakness of Legs: Both (pt states is recovering uses walker at home) Weakness of Arms/Hands: None  Home Assistive Devices/Equipment Home Assistive Devices/Equipment: Environmental consultantWalker (specify type) (4 points)    Abuse/Neglect Assessment (Assessment to be complete while patient is alone) Physical Abuse: Denies Verbal Abuse: Denies Sexual Abuse: Denies Exploitation of patient/patient's resources: Denies Self-Neglect: Denies Values / Beliefs Cultural Requests During Hospitalization: None Spiritual Requests During Hospitalization: None   Advance Directives (For Healthcare) Does patient have an advance directive?: No Would patient like information on creating an advanced directive?: No - patient declined information    Additional Information 1:1 In Past 12 Months?: No CIRT Risk: No Elopement Risk: No Does patient have medical clearance?: Yes     Disposition:  Disposition Initial Assessment Completed for this Encounter: Yes Disposition of Patient: Other dispositions (TBD upon consult with extender)  Hipolito BayleyShean k Mackinzie Vuncannon 10/09/2015 10:50 AM

## 2015-10-09 NOTE — ED Provider Notes (Signed)
-----------------------------------------   8:49 PM on 10/09/2015 -----------------------------------------   Blood pressure 130/94, pulse 85, temperature 98.4 F (36.9 C), temperature source Oral, resp. rate 18, height 6' (1.829 m), weight 240 lb (108.863 kg), SpO2 97 %.  The patient had no acute events since last update.  Calm and cooperative at this time.  Disposition is pending per Psychiatry/Behavioral Medicine team recommendations.   Patient was seen and evaluated by psychiatry and plans for discharge. He will follow-up for substance abuse on an outpatient basis and was given appropriate information.  Jennye MoccasinBrian S Quigley, MD 10/09/15 [phone]

## 2016-02-13 ENCOUNTER — Emergency Department
Admission: EM | Admit: 2016-02-13 | Discharge: 2016-02-14 | Disposition: A | Discharge status: Home / Self Care | Payer: Self-pay | Attending: Emergency Medicine | Admitting: Emergency Medicine

## 2016-02-13 ENCOUNTER — Encounter: Payer: Self-pay | Admitting: *Deleted

## 2016-02-13 DIAGNOSIS — T43212A Poisoning by selective serotonin and norepinephrine reuptake inhibitors, intentional self-harm, initial encounter: Secondary | ICD-10-CM | POA: Insufficient documentation

## 2016-02-13 DIAGNOSIS — E119 Type 2 diabetes mellitus without complications: Secondary | ICD-10-CM | POA: Insufficient documentation

## 2016-02-13 DIAGNOSIS — E785 Hyperlipidemia, unspecified: Secondary | ICD-10-CM | POA: Diagnosis present

## 2016-02-13 DIAGNOSIS — Z7984 Long term (current) use of oral hypoglycemic drugs: Secondary | ICD-10-CM | POA: Insufficient documentation

## 2016-02-13 DIAGNOSIS — F10939 Alcohol use, unspecified with withdrawal, unspecified: Secondary | ICD-10-CM | POA: Diagnosis present

## 2016-02-13 DIAGNOSIS — F1020 Alcohol dependence, uncomplicated: Secondary | ICD-10-CM | POA: Diagnosis present

## 2016-02-13 DIAGNOSIS — Z79899 Other long term (current) drug therapy: Secondary | ICD-10-CM | POA: Insufficient documentation

## 2016-02-13 DIAGNOSIS — T50902A Poisoning by unspecified drugs, medicaments and biological substances, intentional self-harm, initial encounter: Secondary | ICD-10-CM

## 2016-02-13 DIAGNOSIS — I10 Essential (primary) hypertension: Secondary | ICD-10-CM | POA: Insufficient documentation

## 2016-02-13 DIAGNOSIS — F1010 Alcohol abuse, uncomplicated: Secondary | ICD-10-CM | POA: Insufficient documentation

## 2016-02-13 DIAGNOSIS — F332 Major depressive disorder, recurrent severe without psychotic features: Secondary | ICD-10-CM | POA: Diagnosis present

## 2016-02-13 DIAGNOSIS — F1994 Other psychoactive substance use, unspecified with psychoactive substance-induced mood disorder: Secondary | ICD-10-CM | POA: Insufficient documentation

## 2016-02-13 DIAGNOSIS — F10239 Alcohol dependence with withdrawal, unspecified: Secondary | ICD-10-CM | POA: Diagnosis present

## 2016-02-13 DIAGNOSIS — I2510 Atherosclerotic heart disease of native coronary artery without angina pectoris: Secondary | ICD-10-CM | POA: Insufficient documentation

## 2016-02-13 DIAGNOSIS — T1491XA Suicide attempt, initial encounter: Secondary | ICD-10-CM

## 2016-02-13 DIAGNOSIS — Z7982 Long term (current) use of aspirin: Secondary | ICD-10-CM | POA: Insufficient documentation

## 2016-02-13 LAB — URINE DRUG SCREEN, QUALITATIVE (ARMC ONLY)
Amphetamines, Ur Screen: NOT DETECTED
BARBITURATES, UR SCREEN: NOT DETECTED
BENZODIAZEPINE, UR SCRN: NOT DETECTED
CANNABINOID 50 NG, UR ~~LOC~~: NOT DETECTED
COCAINE METABOLITE, UR ~~LOC~~: NOT DETECTED
MDMA (Ecstasy)Ur Screen: NOT DETECTED
METHADONE SCREEN, URINE: NOT DETECTED
Opiate, Ur Screen: NOT DETECTED
Phencyclidine (PCP) Ur S: NOT DETECTED
TRICYCLIC, UR SCREEN: NOT DETECTED

## 2016-02-13 LAB — COMPREHENSIVE METABOLIC PANEL
ALK PHOS: 37 U/L — AB (ref 38–126)
ALT: 47 U/L (ref 17–63)
ANION GAP: 12 (ref 5–15)
AST: 50 U/L — AB (ref 15–41)
Albumin: 4 g/dL (ref 3.5–5.0)
BILIRUBIN TOTAL: 0.3 mg/dL (ref 0.3–1.2)
BUN: 10 mg/dL (ref 6–20)
CALCIUM: 8.2 mg/dL — AB (ref 8.9–10.3)
CO2: 22 mmol/L (ref 22–32)
Chloride: 101 mmol/L (ref 101–111)
Creatinine, Ser: 0.97 mg/dL (ref 0.61–1.24)
GFR calc Af Amer: >60 mL/min (ref >60)
Glucose, Bld: 236 mg/dL — ABNORMAL HIGH (ref 65–99)
POTASSIUM: 4 mmol/L (ref 3.5–5.1)
Sodium: 135 mmol/L (ref 135–145)
TOTAL PROTEIN: 7 g/dL (ref 6.5–8.1)

## 2016-02-13 LAB — CBC WITH DIFFERENTIAL/PLATELET
BASOS ABS: 0 10*3/uL (ref 0–0.1)
BASOS PCT: 1 %
EOS PCT: 1 %
Eosinophils Absolute: 0.1 10*3/uL (ref 0–0.7)
HCT: 42.4 % (ref 40.0–52.0)
Hemoglobin: 14.4 g/dL (ref 13.0–18.0)
LYMPHS PCT: 30 %
Lymphs Abs: 1.3 10*3/uL (ref 1.0–3.6)
MCH: 30.3 pg (ref 26.0–34.0)
MCHC: 34.1 g/dL (ref 32.0–36.0)
MCV: 89 fL (ref 80.0–100.0)
Monocytes Absolute: 0.4 10*3/uL (ref 0.2–1.0)
Monocytes Relative: 9 %
Neutro Abs: 2.5 10*3/uL (ref 1.4–6.5)
Neutrophils Relative %: 59 %
PLATELETS: 210 10*3/uL (ref 150–440)
RBC: 4.76 MIL/uL (ref 4.40–5.90)
RDW: 14.3 % (ref 11.5–14.5)
WBC: 4.3 10*3/uL (ref 3.8–10.6)

## 2016-02-13 LAB — ETHANOL: ALCOHOL ETHYL (B): 341 mg/dL — AB (ref <5)

## 2016-02-13 LAB — PROTIME-INR
INR: 1.06
PROTHROMBIN TIME: 13.8 s (ref 11.4–15.2)

## 2016-02-13 LAB — ACETAMINOPHEN LEVEL

## 2016-02-13 LAB — SALICYLATE LEVEL

## 2016-02-13 LAB — GLUCOSE, CAPILLARY: GLUCOSE-CAPILLARY: 180 mg/dL — AB (ref 65–99)

## 2016-02-13 MED ORDER — VITAMIN B-1 100 MG PO TABS
ORAL_TABLET | ORAL | Status: AC
  Administered 2016-02-13: 100 mg via ORAL
  Filled 2016-02-13: qty 1

## 2016-02-13 MED ORDER — ASPIRIN EC 81 MG PO TBEC
81.0000 mg | DELAYED_RELEASE_TABLET | Freq: Every day | ORAL | Status: DC
  Administered 2016-02-13 – 2016-02-14 (×2): 81 mg via ORAL
  Filled 2016-02-13 (×2): qty 1

## 2016-02-13 MED ORDER — FOLIC ACID 1 MG PO TABS
1.0000 mg | ORAL_TABLET | Freq: Every day | ORAL | Status: DC
  Administered 2016-02-13 – 2016-02-14 (×2): 1 mg via ORAL
  Filled 2016-02-13: qty 1

## 2016-02-13 MED ORDER — ADULT MULTIVITAMIN W/MINERALS CH
ORAL_TABLET | ORAL | Status: AC
  Administered 2016-02-13: 1 via ORAL
  Filled 2016-02-13: qty 1

## 2016-02-13 MED ORDER — METOPROLOL SUCCINATE ER 50 MG PO TB24
25.0000 mg | ORAL_TABLET | Freq: Every day | ORAL | Status: DC
  Administered 2016-02-13 – 2016-02-14 (×2): 25 mg via ORAL
  Filled 2016-02-13 (×2): qty 1

## 2016-02-13 MED ORDER — ADULT MULTIVITAMIN W/MINERALS CH
1.0000 | ORAL_TABLET | Freq: Every day | ORAL | Status: DC
  Administered 2016-02-13 – 2016-02-14 (×2): 1 via ORAL
  Filled 2016-02-13: qty 1

## 2016-02-13 MED ORDER — INSULIN GLARGINE 100 UNIT/ML ~~LOC~~ SOLN
10.0000 [IU] | Freq: Every day | SUBCUTANEOUS | Status: DC
  Administered 2016-02-14: 10 [IU] via SUBCUTANEOUS
  Filled 2016-02-13 (×4): qty 0.1

## 2016-02-13 MED ORDER — ONDANSETRON HCL 4 MG/2ML IJ SOLN
4.0000 mg | Freq: Once | INTRAMUSCULAR | Status: DC
  Filled 2016-02-13: qty 2

## 2016-02-13 MED ORDER — INSULIN ASPART 100 UNIT/ML ~~LOC~~ SOLN
0.0000 [IU] | Freq: Three times a day (TID) | SUBCUTANEOUS | Status: DC
  Administered 2016-02-14 (×2): 3 [IU] via SUBCUTANEOUS
  Filled 2016-02-13 (×3): qty 3

## 2016-02-13 MED ORDER — CHLORDIAZEPOXIDE HCL 25 MG PO CAPS
25.0000 mg | ORAL_CAPSULE | Freq: Once | ORAL | Status: AC
  Administered 2016-02-13: 25 mg via ORAL
  Filled 2016-02-13: qty 1

## 2016-02-13 MED ORDER — LISINOPRIL 5 MG PO TABS
5.0000 mg | ORAL_TABLET | Freq: Every day | ORAL | Status: DC
  Administered 2016-02-13 – 2016-02-14 (×2): 5 mg via ORAL
  Filled 2016-02-13 (×2): qty 1

## 2016-02-13 MED ORDER — FAMOTIDINE 20 MG PO TABS
20.0000 mg | ORAL_TABLET | Freq: Two times a day (BID) | ORAL | Status: DC
  Administered 2016-02-13 – 2016-02-14 (×2): 20 mg via ORAL
  Filled 2016-02-13 (×3): qty 1

## 2016-02-13 MED ORDER — SODIUM CHLORIDE 0.9 % IV BOLUS (SEPSIS)
1000.0000 mL | Freq: Once | INTRAVENOUS | Status: AC
  Administered 2016-02-13: 1000 mL via INTRAVENOUS

## 2016-02-13 MED ORDER — FOLIC ACID 1 MG PO TABS
ORAL_TABLET | ORAL | Status: AC
  Administered 2016-02-13: 1 mg via ORAL
  Filled 2016-02-13: qty 1

## 2016-02-13 MED ORDER — METFORMIN HCL 500 MG PO TABS
1000.0000 mg | ORAL_TABLET | Freq: Two times a day (BID) | ORAL | Status: DC
  Administered 2016-02-13: 1000 mg via ORAL
  Filled 2016-02-13: qty 2

## 2016-02-13 MED ORDER — THIAMINE HCL 100 MG/ML IJ SOLN: 100.0000 mg | Freq: Every day | INTRAMUSCULAR | Status: DC

## 2016-02-13 MED ORDER — VITAMIN B-1 100 MG PO TABS
100.0000 mg | ORAL_TABLET | Freq: Every day | ORAL | Status: DC
  Administered 2016-02-13 – 2016-02-14 (×2): 100 mg via ORAL
  Filled 2016-02-13: qty 1

## 2016-02-13 MED ORDER — LORAZEPAM 1 MG PO TABS: 1.0000 mg | ORAL_TABLET | Freq: Four times a day (QID) | ORAL | Status: DC | PRN

## 2016-02-13 MED ORDER — LORAZEPAM 2 MG/ML IJ SOLN: 1.0000 mg | Freq: Four times a day (QID) | INTRAMUSCULAR | Status: DC | PRN

## 2016-02-13 NOTE — ED Triage Notes (Signed)
Pt arrives via EMS from home after taking12 100 mg trazadone at 1pm, pt states he was trying to kill himself, when asked why he states "its a long story", states [place], pt lethargic upon arrival, slurred speech

## 2016-02-13 NOTE — ED Notes (Signed)
Dr. Huel CoteQuigley notified of critical ETOH

## 2016-02-13 NOTE — ED Notes (Signed)
Pt vomiting, at this time no zofran ordered per EDP

## 2016-02-13 NOTE — BH Assessment (Signed)
Assessment Note  Justin RavelingMickey Len [name] is an 50 y.o. male who presents to the ER due to an intentional overdose. Patient reports he's struggled with depression "for sometime." He states, he's had thoughts of ending his life for approximately 6 months. He reports of drinking alcohol on a daily basis. He was unable to give an amount of alcohol he drinks. He also abused cocaine in the past. He last used approximately two years ago.  He denies involvement with the legal system and DSS He denies a history of violence and aggression.   During the interview, he patient was calm, cooperative and pleasant.  Diagnosis: Depression                    Alcohol Use Disorder  Past Medical History:  Past Medical History:  Diagnosis Date  . Borderline diabetes   . CAD (coronary artery disease)    a. [place]Cath reportedly n[place].  . Cocaine abuse   . Diabetes mellitus without complication (HCC)   . History of ETOH abuse    a. Cut back beginning in 2014.  Marland Kitchen. HTN (hypertension)   . Hyperlipidemia   . Obesity   . PAF (paroxysmal atrial fibrillation) (HCC)    a. Dx in 2007->s/p DCCV x 2 (last "a few yrs ago");  b. Never anticoagulated (CHA2DS2VASc = 1).    History reviewed. No pertinent surgical history.  Family History:  Family History  Problem Relation Age of Onset  . Lung cancer Mother     Social History:  reports that he has never smoked. He has never used smokeless tobacco. He reports that he drinks alcohol. He reports that he does not use drugs.  Additional Social History:  Alcohol / Drug Use Pain Medications: See PTA Prescriptions: See PTA Over the Counter: See PTA History of alcohol / drug use?: Yes Longest period of sobriety (when/how long): 2 Years Negative Consequences of Use: Personal relationships, Surveyor, quantityinancial, Work / School Withdrawal Symptoms: Tremors, Sweats Substance #1 Name of Substance 1: Alcohol 1 - Age of First Use: 16 1 - Amount (size/oz): Unable to  quantify 1 - Frequency: Daily 1 - Duration: "Since I can remember" 1 - Last Use / Amount: 02/13/2016  CIWA: CIWA-Ar BP: 131/81 Pulse Rate: 75 Nausea and Vomiting: 3 Tactile Disturbances: none Tremor: two Auditory Disturbances: not present Paroxysmal Sweats: no sweat visible Visual Disturbances: not present Anxiety: no anxiety, at ease Headache, Fullness in Head: none present Agitation: normal activity Orientation and Clouding of Sensorium: oriented and can do serial additions CIWA-Ar Total: 5 COWS:    Allergies: No Known Allergies  Home Medications:  (Not in a hospital admission)  OB/GYN Status:  No LMP for male patient.  General Assessment Data [facility] Assessment: In system Is this a Tele or Face-to-Face Assessment?: Face-to-Face Is this an Initial Assessment or a Re-assessment for this encounter?: Initial Assessment Marital status: Divorced [place]iden name: n/a Is patient pregnant?: No Pregnancy Status: No Living Arrangements: Alone Can pt return to current living arrangement?: Yes Admission Status: Involuntary Is patient capable of signing voluntary admission?: No Referral Source: Self/Family/Friend Insurance type: None  Medical Screening Exam [facility]) Medical Exam completed: Yes  Crisis Care Plan Living Arrangements: Alone Legal Guardian: Other: (None) Name of Psychiatrist: Reports of none Name of Therapist: Reports of none  Education Status Is patient currently in school?: No Current Grade: n/a Highest grade of school patient has completed: Some College Name of  school: n/a Contact person: n/a  Risk to self with the past 6 months Suicidal Ideation: Yes-Currently Present Has patient been a risk to self within the past 6 months prior to admission? : Yes Suicidal Intent: Yes-Currently Present Has patient had any suicidal intent within the past 6 months prior to admission? : Yes Is patient at risk for suicide?:  Yes Suicidal Plan?: Yes-Currently Present Has patient had any suicidal plan within the past 6 months prior to admission? : Yes Specify Current Suicidal Plan: Overdose on medication Access to Means: Yes Specify Access to Suicidal Means: Medication What has been your use of drugs/alcohol within the last 12 months?: Alcohol. Past use of Cocaine. Previous Attempts/Gestures: Yes How many times?: 1 Other Self Harm Risks: Active Addiction Triggers for Past Attempts: Spouse contact, Other (Comment), Other personal contacts Intentional Self Injurious Behavior: None Family Suicide History: Unknown Recent stressful life event(s): Financial Problems, Turmoil (Comment), Conflict (Comment) Persecutory voices/beliefs?: No Depression: Yes Depression Symptoms: Feeling worthless/self pity, Fatigue, Guilt, Loss of interest in usual pleasures, Isolating, Tearfulness Substance abuse history and/or treatment for substance abuse?: Yes Suicide prevention information given to non-admitted patients: Not applicable  Risk to Others within the past 6 months Homicidal Ideation: No Does patient have any lifetime risk of violence toward others beyond the six months prior to admission? : No Thoughts of Harm to Others: No Current Homicidal Intent: No Current Homicidal Plan: No Access to Homicidal Means: No Identified Victim: Reports of none History of harm to others?: No Assessment of Violence: None Noted Violent Behavior Description: Reports of none Does patient have access to weapons?: No Criminal Charges Pending?: No Does patient have a court date: No Is patient on probation?: No  Psychosis Hallucinations: None noted Delusions: None noted  Mental Status Report Appearance/Hygiene: In scrubs, In hospital gown Eye Contact: Good Motor Activity: Freedom of movement, Unremarkable Speech: Logical/coherent, Unremarkable Level of Consciousness: Alert Mood: Depressed, Anxious, Helpless, Sad Affect: Anxious,  Appropriate to circumstance, Depressed, Sad Anxiety Level: Minimal Thought Processes: Coherent, Relevant Judgement: Partial Orientation: Person, Place, Time, Situation, Appropriate for developmental age Obsessive Compulsive Thoughts/Behaviors: Minimal  Cognitive Functioning Concentration: Normal Memory: Remote Intact, Recent Intact IQ: Average Insight: Poor Impulse Control: Poor Appetite: Fair Weight Loss: 0 Weight Gain: 0 Sleep: No Change Total Hours of Sleep: 6 Vegetative Symptoms: None  ADLScreening [facility] Assessment Services) Patient's cognitive ability adequate to safely complete daily activities?: Yes Patient able to express need for assistance with ADLs?: Yes Independently performs ADLs?: Yes (appropriate for developmental age)  Prior Inpatient Therapy Prior Inpatient Therapy: Yes Prior Therapy Dates: 11/2014 [facility] Reason for Treatment: Depression and Substance Use  Prior Outpatient Therapy Prior Outpatient Therapy: Yes Prior Therapy Dates: 2016 Prior Therapy Facilty/Provider(s): [facility] Reason for Treatment: Depression and Substance Use Does patient have an ACCT team?: No Does patient have Intensive In-House Services?  : No Does patient have [facility] services? : No Does patient have P4CC services?: No  ADL Screening (condition at time of admission) Patient's cognitive ability adequate to safely complete daily activities?: Yes Is the patient deaf or have difficulty hearing?: No Does the patient have difficulty seeing, even when wearing glasses/contacts?: No Does the patient have difficulty concentrating, remembering, or making decisions?: No Patient able to express need for assistance with ADLs?: Yes Does the patient have difficulty dressing or bathing?: No Independently performs ADLs?: Yes (appropriate for developmental age) Does the patient have difficulty walking or climbing stairs?: No Weakness of Legs: None Weakness of  Arms/Hands: None  Home Assistive Devices/Equipment Home Assistive Devices/Equipment: None  Therapy Consults (therapy consults require a physician order) PT Evaluation Needed: No OT Evalulation Needed: No SLP Evaluation Needed: No Abuse/Neglect Assessment (Assessment to be complete while patient is alone) Physical Abuse: Denies Verbal Abuse: Yes, past (Comment) Sexual Abuse: Denies Exploitation of patient/patient's resources: Denies Self-Neglect: Denies Values / Beliefs Cultural Requests During Hospitalization: None Spiritual Requests During Hospitalization: None Consults Spiritual Care Consult Needed: No Social Work Consult Needed: No      Additional Information 1:1 In Past 12 Months?: No CIRT Risk: No Elopement Risk: No Does patient have medical clearance?: No  Child/Adolescent Assessment Running Away Risk: Denies (Patient is an adult)  Disposition:  Disposition Initial Assessment Completed for this Encounter: Yes Disposition of Patient: Other dispositions (ER MD Ordered Psych Consult)  On Site Evaluation by:   Reviewed with Physician:    Lilyan Gilford MS, LCAS, LPC, NCC, CCSI Therapeutic Triage Specialist 02/13/2016 7:05 PM

## 2016-02-13 NOTE — ED Notes (Signed)
Poison Control Called to to get update. [name] states she is closing case at this time. MD aware. Will need monitoring still. No distress noted.

## 2016-02-13 NOTE — ED Notes (Signed)
EDP at bedside  

## 2016-02-13 NOTE — ED Notes (Signed)
pts clothes, bractlet and glasses's stored in pt belongings bag

## 2016-02-13 NOTE — ED Notes (Signed)
Dr.Clapacs at bedside  

## 2016-02-13 NOTE — Consult Note (Signed)
[facility] Consult   Reason for Consult:  Consult for 50 year old man who was brought to the hospital after taking an overdose of medication Referring Physician:  Marcelene Butte Patient Identification: [name] MRN:  [id number] Principal Diagnosis: Severe recurrent major depression without psychotic features [facility]) Diagnosis:   Patient Active Problem List   Diagnosis Date Noted  . Suicide attempt [T14.91XA] 02/13/2016  . Substance induced mood disorder ([place]) [F19.94] 10/09/2015  . Gout [M10.9] 12/31/2014  . Atrial fibrillation ([place]) [I48.91] 12/31/2014  . Alcohol use disorder, severe, dependence ([place]) [F10.20] 12/28/2014  . Alcohol withdrawal ([place]) [F10.239] 12/28/2014  . Dyslipidemia [E78.5] 12/28/2014  . Stimulant use disorder- cocaine [F15.90] 12/28/2014  . Severe recurrent major depression without psychotic features ([place]) [F33.2] 12/27/2014  . Diabetes ([place]) [E11.9] 12/27/2014  . Hypertension [I10] 12/27/2014    Total Time spent with patient: 1 hour  Subjective:   [name] is a 50 y.o. male patient admitted with "I'm no good to anybody".  HPI:  Patient seen. Chart reviewed. Labs reviewed. Patient was brought to the hospital after he called 911 because he overdosed on medicine. He took approximately 12 of his 100 mg trazodone tablets. He said he did it because he feels like he just wants to die. He says he is no good to anyone. Feels useless. He has had multiple losses including the deaths of several members of his family most notably his son who died in the [place] a year ago. Patient continues to drink heavily. Last time I saw him he was drinking 18-20 beers a day. He couldn't even guess I'm much she was taking today. Denies that he is using any other drugs. Patient says that he has been taking his medicine for his medical problems including his blood pressure and his diabetes. He is not working and has very little to do. Had any hallucinations. Denies any  homicidal ideation.  Substance abuse history: Long-standing alcohol problems. He has been to rehabilitation and has been able to stop for short periods of time in the past. In the past he has told me that he did not have delirium tremens although today he told me that he had had hallucinations from detox in the past.  Medical history: History of hypertension, diabetes, past history of atrial fibrillation although it doesn't look like he is in it now and I don't see any recent anticoagulants. History of gout  Social history: Currently living by himself. He is divorced. Has 1 adult living child that he stays in touch with. Not working. Patient is retired Nature conservation officer but doesn't go to the [place].  Past Psychiatric History: We have seen the patient in the emergency room before usually with alcohol abuse and detox. Last time we saw him he declined an offer to go to the observation unit. He has not that I know of tried to kill himself in the past although he has been very depressed. No known prior inpatient hospitalizations that weren't detox.  Risk to Self: Is patient at risk for suicide?: Yes Risk to Others:   Prior Inpatient Therapy:   Prior Outpatient Therapy:    Past Medical History:  Past Medical History:  Diagnosis Date  . Borderline diabetes   . CAD (coronary artery disease)    a. 2007 MI in [place], [place] ->Cath reportedly nl.  . Cocaine abuse   . Diabetes mellitus without complication ([place])   . History of ETOH abuse    a. Cut back beginning in 2014.  Marland Kitchen  HTN (hypertension)   . Hyperlipidemia   . Obesity   . PAF (paroxysmal atrial fibrillation) (HCC)    a. Dx in 2007->s/p DCCV x 2 (last "a few yrs ago");  b. Never anticoagulated (CHA2DS2VASc = 1).   History reviewed. No pertinent surgical history. Family History:  Family History  Problem Relation Age of Onset  . Lung cancer Mother    Family Psychiatric  History: Does not know of any family history of mental illness or substance abuse  disorder Social History:  History  Alcohol Use  . Yes    Comment: Previously drank heavily.  Cut back in 2014 but still drinks on occassion.     History  Drug Use No    Comment: Habitual crack cocaine usage.    Social History   Social History  . Marital status: Single    Spouse name: N/A  . Number of children: N/A  . Years of education: N/A   Social History Main Topics  . Smoking status: Never Smoker  . Smokeless tobacco: Never Used  . Alcohol use Yes     Comment: Previously drank heavily.  Cut back in 2014 but still drinks on occassion.  . Drug use: No     Comment: Habitual crack cocaine usage.  Marland Kitchen Sexual activity: No   Other Topics Concern  . None   Social History Narrative   The patient says his dad is deceased but his mother still living. 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 He has filed for disability. He is currently divorced and has 2 children. He is living with his cousin.      He does report multiple legal charges related to DUI. He was also driving without a valid driver's license more recently and cannot drive.      Additional Social History:    Allergies:  No Known Allergies  Labs:  Results for orders placed or performed during the hospital encounter of 02/13/16 (from the past 48 hour(s))  Comprehensive metabolic panel     Status: Abnormal   Collection Time: 02/13/16  3:06 PM  Result Value Ref Range   Sodium 135 135 - 145 mmol/L   Potassium 4.0 3.5 - 5.1 mmol/L   Chloride 101 101 - 111 mmol/L   CO2 22 22 - 32 mmol/L   Glucose, Bld 236 (H) 65 - 99 mg/dL   BUN 10 6 - 20 mg/dL   Creatinine, Ser 0.94 0.61 - 1.24 mg/dL   Calcium 8.2 (L) 8.9 - 10.3 mg/dL   Total Protein 7.0 6.5 - 8.1 g/dL   Albumin 4.0 3.5 - 5.0 g/dL   AST 50 (H) 15 - 41 U/L   ALT 47 17 - 63 U/L   Alkaline Phosphatase 37 (L) 38 - 126 U/L   Total Bilirubin 0.3 0.3 - 1.2 mg/dL   GFR calc non Af Amer >60 >60 mL/min   GFR calc Af Amer  >60 >60 mL/min    Comment: (NOTE) The eGFR has been calculated using the CKD EPI equation. This calculation has not been validated in all clinical situations. eGFR's persistently <60 mL/min signify possible Chronic Kidney Disease.    Anion gap 12 5 - 15  CBC with Differential/Platelet     Status: None   Collection Time: 02/13/16  3:06 PM  Result Value Ref Range   WBC 4.3 3.8 - 10.6 K/uL   RBC 4.76 4.40 - 5.90 MIL/uL   Hemoglobin 14.4 13.0 -  18.0 g/dL   HCT 42.4 40.0 - 52.0 %   MCV 89.0 80.0 - 100.0 fL   MCH 30.3 26.0 - 34.0 pg   MCHC 34.1 32.0 - 36.0 g/dL   RDW 14.3 11.5 - 14.5 %   Platelets 210 150 - 440 K/uL   Neutrophils Relative % 59 %   Neutro Abs 2.5 1.4 - 6.5 K/uL   Lymphocytes Relative 30 %   Lymphs Abs 1.3 1.0 - 3.6 K/uL   Monocytes Relative 9 %   Monocytes Absolute 0.4 0.2 - 1.0 K/uL   Eosinophils Relative 1 %   Eosinophils Absolute 0.1 0 - 0.7 K/uL   Basophils Relative 1 %   Basophils Absolute 0.0 0 - 0.1 K/uL  Acetaminophen level     Status: Abnormal   Collection Time: 02/13/16  3:06 PM  Result Value Ref Range   Acetaminophen (Tylenol), Serum <10 (L) 10 - 30 ug/mL    Comment:        THERAPEUTIC CONCENTRATIONS VARY SIGNIFICANTLY. A RANGE OF 10-30 ug/mL MAY BE AN EFFECTIVE CONCENTRATION FOR MANY PATIENTS. HOWEVER, SOME ARE BEST TREATED AT CONCENTRATIONS OUTSIDE THIS RANGE. ACETAMINOPHEN CONCENTRATIONS >150 ug/mL AT 4 HOURS AFTER INGESTION AND >50 ug/mL AT 12 HOURS AFTER INGESTION ARE OFTEN ASSOCIATED WITH TOXIC REACTIONS.   Ethanol     Status: Abnormal   Collection Time: 02/13/16  3:06 PM  Result Value Ref Range   Alcohol, Ethyl (B) 341 (HH) <5 mg/dL    Comment: CRITICAL RESULT CALLED TO, READ BACK BY AND VERIFIED WITH OLIVIA BROOMER 02/13/16 @ 1715  [place] LIMIT FOR SERUM ALCOHOL IS 5 mg/dL FOR MEDICAL PURPOSES ONLY   Salicylate level     Status: None   Collection Time: 02/13/16  3:06 PM  Result Value Ref Range    Salicylate Lvl <3.4 2.8 - 30.0 mg/dL  Protime-INR     Status: None   Collection Time: 02/13/16  3:06 PM  Result Value Ref Range   Prothrombin Time 13.8 11.4 - 15.2 seconds   INR 1.06     Current Facility-Administered Medications  Medication Dose Route Frequency Provider Last Rate Last Dose  . aspirin EC tablet 81 mg  81 mg Oral Daily Gonzella Lex, MD      . chlordiazePOXIDE (LIBRIUM) capsule 25 mg  25 mg Oral Once Gonzella Lex, MD      . famotidine (PEPCID) tablet 20 mg  20 mg Oral BID Gonzella Lex, MD      . insulin glargine (LANTUS) injection 10 Units  10 Units Subcutaneous Daily Kennadee Walthour T Kammie Scioli, MD      . lisinopril (PRINIVIL,ZESTRIL) tablet 5 mg  5 mg Oral Daily Dyllan Kats T Andreina Outten, MD      . metFORMIN (GLUCOPHAGE) tablet 1,000 mg  1,000 mg Oral BID WC Gonzella Lex, MD      . metoprolol succinate (TOPROL-XL) 24 hr tablet 25 mg  25 mg Oral Daily Gonzella Lex, MD      . ondansetron (ZOFRAN) injection 4 mg  4 mg Intravenous Once Daymon Larsen, MD       Current Outpatient Prescriptions  Medication Sig Dispense Refill  . aspirin 81 MG chewable tablet Chew 1 tablet (81 mg total) by mouth daily.    . famotidine (PEPCID) 20 MG tablet Take 20 mg by mouth 2 (two) times daily.    Marland Kitchen gabapentin (NEURONTIN) 300 MG capsule Take 300 mg by mouth 3 (  three) times daily.    . insulin glargine (LANTUS) 100 UNIT/ML injection Inject 10 Units into the skin daily.    Marland Kitchen lisinopril (PRINIVIL,ZESTRIL) 5 MG tablet Take 1 tablet (5 mg total) by mouth daily. 7 tablet 0  . Melatonin 3 MG TABS Take 3 mg by mouth at bedtime as needed.    . metFORMIN (GLUCOPHAGE) 1000 MG tablet Take 1 tablet (1,000 mg total) by mouth 2 (two) times daily with a meal. 14 tablet 0  . metoprolol succinate (TOPROL-XL) 25 MG 24 hr tablet Take 25 mg by mouth daily.    Marland Kitchen tiZANidine (ZANAFLEX) 2 MG tablet Take 2 mg by mouth every 6 (six) hours as needed.      Musculoskeletal: Strength & Muscle Tone: within normal limits Gait &  Station: ataxic Patient leans: Backward  Psychiatric Specialty Exam: Physical Exam  Nursing note and vitals reviewed. Constitutional: He appears well-developed and well-nourished.  HENT:  Head: Normocephalic and atraumatic.  Eyes: Conjunctivae are normal. Pupils are equal, round, and reactive to light.  Neck: Normal range of motion.  Cardiovascular: Regular rhythm and normal heart sounds.   Respiratory: He is in respiratory distress.  GI: Soft.  Musculoskeletal: Normal range of motion.  Neurological: He is alert. He displays tremor. Coordination abnormal.  Skin: Skin is warm and dry.  Psychiatric: His mood appears anxious. His speech is delayed. He is slowed. Cognition and memory are impaired. He expresses impulsivity. He exhibits a depressed mood. He expresses suicidal ideation. He expresses suicidal plans.    Review of Systems  Constitutional: Positive for malaise/fatigue.  HENT: Negative.   Eyes: Negative.   Respiratory: Negative.   Cardiovascular: Negative.   Gastrointestinal: Negative.   Musculoskeletal: Negative.   Skin: Negative.   Neurological: Negative.   Psychiatric/Behavioral: Positive for depression, substance abuse and suicidal ideas. Negative for hallucinations. The patient is nervous/anxious and has insomnia.     Blood pressure 127/86, pulse 72, temperature 98.6 F (37 C), temperature source Oral, resp. rate 18, height 6' (1.829 m), weight 115.7 kg (255 lb), SpO2 96 %.Body mass index is 34.58 kg/m.  General Appearance: Disheveled  Eye Contact:  Minimal  Speech:  Garbled  Volume:  Decreased  Mood:  Dysphoric  Affect:  Constricted  Thought Process:  Disorganized  Orientation:  Full (Time, Place, and Person)  Thought Content:  Logical  Suicidal Thoughts:  Yes.  with intent/plan  Homicidal Thoughts:  No  Memory:  Immediate;   Good Recent;   Fair Remote;   Fair  Judgement:  Impaired  Insight:  Shallow  Psychomotor Activity:  Tremor  Concentration:   Concentration: Fair  Recall:  [employer] of Knowledge:  Fair  Language:  Fair  Akathisia:  No  Handed:  Right  AIMS (if indicated):     Assets:  Desire for Improvement Housing  ADL's:  Intact  Cognition:  Impaired,  Mild  Sleep:        Treatment Plan Summary: Daily contact with patient to assess and evaluate symptoms and progress in treatment, Medication management and Plan 50 year old man who presents intoxicated blood alcohol level about 340. Made a serious suicide attempt but got help immediately afterwards. Still expressing lots of depressive symptoms and suicidality. Case reviewed with emergency room doctor. Patient will be placed under involuntary commitment and we will work towards admitting him to the psychiatry ward as soon as he is stable from the overdose. Restarting his outpatient medicine. I don't see any evidence that he's really been on  any anticoagulant recently and he is not currently in atrial fibrillation. We will give him the usual detox protocol. Defer any new antidepressants until he is stabilized medically. Case reviewed with TTS.  Disposition: Recommend psychiatric Inpatient admission when medically cleared. Supportive therapy provided about ongoing stressors.  Alethia Berthold, MD 02/13/2016 5:39 PM

## 2016-02-13 NOTE — ED Notes (Signed)
Pt placed on 2L [place]

## 2016-02-13 NOTE — ED Provider Notes (Addendum)
Time Seen: Approximately 1634  I have reviewed the triage notes  Chief Complaint: Suicidal and Drug Overdose   History of Present Illness: [name] is a 50 y.o. male *who presents after recent ingestion of [id number] mg trazodone at 1 PM today. He states he did drink alcohol also and it sounds as though he has a long history of alcohol and polysubstance abuse. Patient states he was trying to kill himself. He states he's now feeling shame about his attempt. On arrival was felt nauseated with a small amount of vomiting. He denies any hallucinations  Past Medical History:  Diagnosis Date  . Borderline diabetes   . CAD (coronary artery disease)    a. [place] MI in [place] ->Cath reportedly nl.  . Cocaine abuse   . Diabetes mellitus without complication (HCC)   . History of ETOH abuse    a. Cut back beginning in 2014.  Marland Kitchen. HTN (hypertension)   . Hyperlipidemia   . Obesity   . PAF (paroxysmal atrial fibrillation) (H[facility])    a. Dx in 2007->s/p DCCV x 2 (last "a few yrs ago");  b. Never anticoagulated (CHA2DS2VASc = 1).    Patient Active Problem List   Diagnosis Date Noted  . Suicide attempt 02/13/2016  . Substance induced mood disorder (HCC) 10/09/2015  . Gout 12/31/2014  . Atrial fibrillation (HCC) 12/31/2014  . Alcohol use disorder, severe, dependence (HCC) 12/28/2014  . Alcohol withdrawal (HCC) 12/28/2014  . Dyslipidemia 12/28/2014  . Stimulant use disorder- cocaine 12/28/2014  . Severe recurrent major depression without psychotic features (H[facility]) 12/27/2014  . Diabetes (HCC) 12/27/2014  . Hypertension 12/27/2014    History reviewed. No pertinent surgical history.  History reviewed. No pertinent surgical history.  Current Outpatient Rx  . Order #: [id number] Class: No Print  . Order #: [id number] Class: Historical Med  . Order #: [id number] Class: Historical Med  . Order #: [id number] Class: Historical Med  . Order #: [id number] Class: Print  . Order #: [id number] Class:  Historical Med  . Order #: [id number] Class: Print  . Order #: [id number] Class: Historical Med  . Order #: [id number] Class: Historical Med    Allergies:  Patient has no known allergies.  Family History: Family History  Problem Relation Age of Onset  . Lung cancer Mother     Social History: Social History  Substance Use Topics  . Smoking status: Never Smoker  . Smokeless tobacco: Never Used  . Alcohol use Yes     Comment: Previously drank heavily.  Cut back in 2014 but still drinks on occassion.     Review of Systems:   10 point review of systems was performed and was otherwise negative:  Constitutional: No fever Eyes: No visual disturbances ENT: No sore throat, ear pain Cardiac: No chest pain Respiratory: No shortness of breath, wheezing, or stridor Abdomen: No abdominal pain, no vomiting, No diarrhea Endocrine: No weight loss, No night sweats Extremities: No peripheral edema, cyanosis Skin: No rashes, easy bruising Neurologic: No focal weakness, trouble with speech or swollowing Urologic: No dysuria, Hematuria, or urinary frequency   Physical Exam:  ED Triage Vitals  Enc Vitals Group     BP 02/13/16 1607 116/80     Pulse Rate 02/13/16 1607 69     Resp 02/13/16 1607 18     Temp 02/13/16 1640 98.6 F (37 C)     Temp Source 02/13/16 1640 Oral     SpO2 02/13/16 1604 95 %     Weight 02/13/16 1608 255 lb (115.7 kg)  Height 02/13/16 1608 6' (1.829 m)     Head Circumference --      Peak Flow --      Pain Score --      Pain Loc --      Pain Edu? --      Excl. in GC? --     General: Awake , Alert , and Oriented times 3; GCS 15 Inebriated Head: Normal cephalic , atraumatic Eyes: Pupils equal , round, reactive to light Nose/Throat: No nasal drainage, patent upper airway without erythema or exudate.  Neck: Supple, Full range of motion, No anterior adenopathy or palpable thyroid masses Lungs: Clear to ascultation without wheezes , rhonchi, or rales Heart:  Regular rate, regular rhythm without murmurs , gallops , or rubs Abdomen: Bees Soft, non tender without rebound, guarding , or rigidity; bowel sounds positive and symmetric in all 4 quadrants. No organomegaly .        Extremities: 2 plus symmetric pulses. No edema, clubbing or cyanosis Neurologic: normal ambulation, Motor symmetric without deficits, sensory intact Skin: warm, dry, no rashes   Labs:   All laboratory work was reviewed including any pertinent negatives or positives listed below:  Labs Reviewed  COMPREHENSIVE METABOLIC PANEL - Abnormal; Notable for the following:       Result Value   Glucose, Bld 236 (*)    Calcium 8.2 (*)    AST 50 (*)    Alkaline Phosphatase 37 (*)    All other components within normal limits  ACETAMINOPHEN LEVEL - Abnormal; Notable for the following:    Acetaminophen (Tylenol), Serum <10 (*)    All other components within normal limits  ETHANOL - Abnormal; Notable for the following:    Alcohol, Ethyl (B) 341 (*)    All other components within normal limits  CBC WITH DIFFERENTIAL/PLATELET  SALICYLATE LEVEL  PROTIME-INR  URINE DRUG SCREEN, QUALITATIVE ([facility] ONLY)  HEMOGLOBIN A1C  Blood alcohol level significantly elevated  EKG: ED ECG REPORT I, Jennye Moccasin, the attending physician, personally viewed and interpreted this ECG.  Date: 02/13/2016 EKG Time: *1616Rate: 74 Rhythm: normal sinus rhythm QRS Axis: normal Intervals: Left anterior fascicular block Mild intraventricular delay  ST/T Wave abnormalities: normal Conduction Disturbances: none Narrative Interpretation: unremarkable No acute ischemic changes and some artifact.  EKG #2 ED ECG REPORT I, Jennye Moccasin, the attending physician, personally viewed and interpreted this ECG.  Date: 02/13/2016 EKG Time: 2116 Rate: *99 Rhythm: normal sinus rhythm QRS Axis: normal Intervals: QT interval 388 ms Partial right bundle branch block ST/T Wave abnormalities: normal Conduction  Disturbances: none Narrative Interpretation: unremarkable Left anterior fascicular block No significant change      ED Course:  Patient's stay here less far as been uneventful. He is not completely medically cleared due to his significantly elevated alcohol level. The patient will be established on a continuous cardiac monitor. I felt his QT interval at this time is within normal limits. We may repeat the EKG later to make sure there is no significant change. Patient's been seen by psychiatry and involuntary commitment paperwork has been filled out. He also has a long history of alcohol abuse and have to be followed closely for alcohol withdrawal-type symptoms. Previous history of atrial fibrillation and states that he is on anticoagulation meds though he is not sure the name at this time. Since he is currently in a normal sinus rhythm I did not see a reason to anticoagulate the patient just yet. Clinical Course  Assessment: * Suicidal ideation Trazodone ingestion History of alcohol abuse     Plan: * Medically monitor Involuntary commitment           Jennye MoccasinBrian S Saher Davee, MD 02/13/16 1837    Jennye MoccasinBrian S Armenta Erskin, MD 02/13/16 2125

## 2016-02-13 NOTE — ED Notes (Signed)
Pt o2 taking off to assess o2 sats 91-95% RA MD aware reapplied o2 via [place] at this time.

## 2016-02-13 NOTE — ED Notes (Signed)
Dr. Huel CoteQuigley notified of poision controls suggestion of 1 amp of bicarb and labs, lab orders in, no orders for meds at this time

## 2016-02-14 LAB — GLUCOSE, CAPILLARY
GLUCOSE-CAPILLARY: 157 mg/dL — AB (ref 65–99)
GLUCOSE-CAPILLARY: 183 mg/dL — AB (ref 65–99)

## 2016-02-14 LAB — ETHANOL: Alcohol, Ethyl (B): 21 mg/dL — ABNORMAL HIGH (ref <5)

## 2016-02-14 NOTE — ED Notes (Signed)
Pt told RN he was brought to hospital via ambulance and has no ride home. RN told pt social worker would be made aware, but this writer could not make any guarantees. Pt accepting.

## 2016-02-14 NOTE — Discharge Instructions (Signed)

## 2016-02-14 NOTE — ED Notes (Signed)
Md made aware of low bp and hr ordering 1L [place]  At this time. Will continue to monitor.

## 2016-02-14 NOTE — ED Notes (Addendum)
Psychiatrist re assessed pt.  Pt will be discharged.   Maintained on 15 minute checks and observation by security camera for safety.

## 2016-02-14 NOTE — ED Notes (Addendum)
Pt clothing searched prior to getting dressed for discharge. Pills found in pant pocket. Pills disposed of  per psychiatrist (Dr. Toni Amendlapacs) and security. Witnessed by Clydie BraunKaren, Charity fundraiserN. Belongings returned to pt upon discharge.   Maintained on 15 minute checks and observation by security camera for safety.

## 2016-02-14 NOTE — ED Provider Notes (Signed)
-----------------------------------------   3:57 PM on 02/14/2016 -----------------------------------------   BP 125/86   Pulse 60   Temp 98.5 F (36.9 C) (Oral)   Resp 14   Ht 6' (1.829 m)   Wt 115.7 kg   SpO2 96%   BMI 34.58 kg/m   I spoke in person with Dr. Toni Amendlapacs who evaluated the patient and feels that the patient is safe to be discharged with outpatient follow-up.  The patient is hemodynamically stable and appropriate to go at this time.    Loleta Roseory Giovanie Lefebre, MD 02/14/16 [phone]

## 2016-02-14 NOTE — ED Notes (Signed)
ED BHU PLACEMENT JUSTIFICATION Is the patient under IVC or is there intent for IVC: Yes.   Is the patient medically cleared: No. Is there vacancy in the ED BHU: Yes.   Is the population mix appropriate for patient: No. Is the patient awaiting placement in inpatient or outpatient setting: Yes.   Has the patient had a psychiatric consult: Yes.   Survey of unit performed for contraband, proper placement and condition of furniture, tampering with fixtures in bathroom, shower, and each patient room: Yes.  ; Findings: none APPEARANCE/BEHAVIOR calm, cooperative and adequate rapport can be established NEURO ASSESSMENT Orientation: time, place and person Hallucinations: No.None noted (Hallucinations) Speech: Rate:slow Gait: pt has ambulated RESPIRATORY ASSESSMENT Normal expansion.  Clear to auscultation.  No rales, rhonchi, or wheezing. CARDIOVASCULAR ASSESSMENT regular rate and rhythm, S1, S2 normal, no murmur, click, rub or gallop GASTROINTESTINAL ASSESSMENT soft, nontender, BS WNL, no r/g EXTREMITIES normal strength, tone, and muscle mass PLAN OF CARE Provide calm/safe environment. Vital signs assessed three times a day. ED Assessment once each 12-hour shift. Collaborate with intake RN daily or as condition indicates. Assure the ED provider has rounded once each shift. Provide and encourage hygiene. Provide redirection as needed. Assess for escalating behavior; address immediately and inform ED provider.

## 2016-02-14 NOTE — ED Notes (Signed)
Breakfast was given to patient. 

## 2016-02-14 NOTE — ED Notes (Signed)
Blood sugar was 157 notified nurse Bill S.

## 2016-02-14 NOTE — ED Notes (Signed)
Discharge instructions reviewed with patient.  Belongings returned to pt. Pt discharged to lobby.

## 2016-02-14 NOTE — Progress Notes (Signed)
LCSW informed this patient is able to discharge back home as he is not actively suicidal or homicidal. LCSW provided patient with resources in the community/ [facility] for [facility] or [facility]. In addition will provide patient with both AA and NA meeting list and Armenianited Occupational psychologistway resource list.   Arrie SenateClaudine Malakai Schoenherr LCSW [phone]

## 2016-02-14 NOTE — ED Notes (Signed)
Patient assigned to appropriate care area. Patient oriented to unit/care area: Informed that, for their safety, care areas are designed for safety and monitored by security cameras at all times; and visiting hours explained to patient. Patient verbalizes understanding, and verbal contract for safety obtained.  Pt calm, cooperative and pleasant. Pt denies SI/HI and AVH. Pt given a drink. No concerns voiced. Maintained on 15 minute checks and observation by security camera for safety.

## 2016-02-14 NOTE — ED Notes (Signed)
Pt laying on bed resting. No concerns voiced. Lunch tray given. Pt wating on inpatient placement. Maintained on 15 minute checks and observation by security camera for safety.
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
## 2016-02-14 NOTE — ED Provider Notes (Signed)
-----------------------------------------   7:26 AM on 02/14/2016 -----------------------------------------   Blood pressure 106/65, pulse 72, temperature 98.5 F (36.9 C), temperature source Oral, resp. rate 16, height 6' (1.829 m), weight 255 lb (115.7 kg), SpO2 93 %.  The patient had no acute events since last update.  Calm and cooperative at this time.  [facility]. Disposition is pending Psychiatry/Behavioral Medicine team recommendations.     Irean HongJade J Diar Berkel, MD 02/14/16 [phone]
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
## 2016-08-23 ENCOUNTER — Encounter: Payer: Self-pay | Admitting: Medical Oncology

## 2016-08-23 ENCOUNTER — Emergency Department: Payer: Self-pay

## 2016-08-23 ENCOUNTER — Emergency Department
Admission: EM | Admit: 2016-08-23 | Discharge: 2016-08-23 | Disposition: A | Discharge status: Home / Self Care | Payer: Self-pay | Attending: Emergency Medicine | Admitting: Emergency Medicine

## 2016-08-23 DIAGNOSIS — Y999 Unspecified external cause status: Secondary | ICD-10-CM | POA: Insufficient documentation

## 2016-08-23 DIAGNOSIS — Z7982 Long term (current) use of aspirin: Secondary | ICD-10-CM | POA: Insufficient documentation

## 2016-08-23 DIAGNOSIS — W0110XA Fall on same level from slipping, tripping and stumbling with subsequent striking against unspecified object, initial encounter: Secondary | ICD-10-CM | POA: Insufficient documentation

## 2016-08-23 DIAGNOSIS — S3992XA Unspecified injury of lower back, initial encounter: Secondary | ICD-10-CM | POA: Insufficient documentation

## 2016-08-23 DIAGNOSIS — I2510 Atherosclerotic heart disease of native coronary artery without angina pectoris: Secondary | ICD-10-CM | POA: Insufficient documentation

## 2016-08-23 DIAGNOSIS — W19XXXA Unspecified fall, initial encounter: Secondary | ICD-10-CM

## 2016-08-23 DIAGNOSIS — Y9210 Unspecified residential institution as the place of occurrence of the external cause: Secondary | ICD-10-CM | POA: Insufficient documentation

## 2016-08-23 DIAGNOSIS — Z79899 Other long term (current) drug therapy: Secondary | ICD-10-CM | POA: Insufficient documentation

## 2016-08-23 DIAGNOSIS — M549 Dorsalgia, unspecified: Secondary | ICD-10-CM

## 2016-08-23 DIAGNOSIS — I10 Essential (primary) hypertension: Secondary | ICD-10-CM | POA: Insufficient documentation

## 2016-08-23 DIAGNOSIS — Y939 Activity, unspecified: Secondary | ICD-10-CM | POA: Insufficient documentation

## 2016-08-23 DIAGNOSIS — E119 Type 2 diabetes mellitus without complications: Secondary | ICD-10-CM | POA: Insufficient documentation

## 2016-08-23 HISTORY — DX: Unspecified injury at unspecified level of thoracic spinal cord, initial encounter: S24.109A

## 2016-08-23 MED ORDER — LORAZEPAM 2 MG/ML IJ SOLN: 1.0000 mg | Freq: Once | INTRAMUSCULAR | Status: DC

## 2016-08-23 MED ORDER — IBUPROFEN 400 MG PO TABS
400.0000 mg | ORAL_TABLET | Freq: Once | ORAL | Status: AC
  Administered 2016-08-23: 400 mg via ORAL
  Filled 2016-08-23: qty 1

## 2016-08-23 MED ORDER — LORAZEPAM 2 MG/ML IJ SOLN
1.0000 mg | Freq: Once | INTRAMUSCULAR | Status: AC
  Administered 2016-08-23: 1 mg via INTRAVENOUS
  Filled 2016-08-23: qty 1

## 2016-08-23 NOTE — ED Provider Notes (Signed)
[facility] Emergency Department Provider Note   ____________________________________________   I have reviewed the triage vital signs and the nursing notes.   HISTORY  Chief Complaint Fall   History limited by: Not Limited   HPI [name] is a 51 y.o. male who presents to the emergency department today after a fall. The patient states he fell forward and hit his head against the wall. He denies any loss of consciousness. He states since that time he has had tingling in his hands. He was recently seen at [facility] after a [facility] and was diagnosed with spinal stenosis of the cervical spine and a spinal cord injury which didn't leave him with some lower leg numbness and tingling. Patient states that his lower leg numbness and tingling in his the same as it was prior to today's fall.    Past Medical History:  Diagnosis Date  . Borderline diabetes   . CAD (coronary artery disease)    a. [place] ->Cath reportedly nl.  . Cocaine abuse   . Diabetes mellitus without complication (HCC)   . History of ETOH abuse    a. Cut back beginning in 2014.  Marland Kitchen. HTN (hypertension)   . Hyperlipidemia   . Obesity   . PAF (paroxysmal atrial fibrillation) (HCC)    a. Dx in 2007->s/p DCCV x 2 (last "a few yrs ago");  b. Never anticoagulated (CHA2DS2VASc = 1).  . Spinal cord injury, thoracic region [facility](HCC)     Patient Active Problem List   Diagnosis Date Noted  . Suicide attempt (HCC) 02/13/2016  . Substance induced mood disorder (HCC) 10/09/2015  . Gout 12/31/2014  . Atrial fibrillation (HCC) 12/31/2014  . Alcohol use disorder, severe, dependence (HCC) 12/28/2014  . Alcohol withdrawal (HCC) 12/28/2014  . Dyslipidemia 12/28/2014  . Stimulant use disorder- cocaine 12/28/2014  . Severe recurrent major depression without psychotic features (HCC) 12/27/2014  . Diabetes (HCC) 12/27/2014  . Hypertension 12/27/2014    History reviewed. No pertinent surgical  history.  Prior to Admission medications   Medication Sig Start Date End Date Taking? Authorizing Provider  aspirin 81 MG chewable tablet Chew 1 tablet (81 mg total) by mouth daily. 01/01/15   Jimmy FootmanHernandez-Gonzalez, Andrea, MD  famotidine (PEPCID) 20 MG tablet Take 20 mg by mouth 2 (two) times daily. 08/29/15   [provider]  gabapentin (NEURONTIN) 300 MG capsule Take 300 mg by mouth 3 (three) times daily. 08/29/15   [provider]  insulin glargine (LANTUS) 100 UNIT/ML injection Inject 10 Units into the skin daily. 08/29/15   [provider]  lisinopril (PRINIVIL,ZESTRIL) 5 MG tablet Take 1 tablet (5 mg total) by mouth daily. 01/01/15   Jimmy FootmanHernandez-Gonzalez, Andrea, MD  Melatonin 3 MG TABS Take 3 mg by mouth at bedtime as needed. 08/29/15   [provider]  metFORMIN (GLUCOPHAGE) 1000 MG tablet Take 1 tablet (1,000 mg total) by mouth 2 (two) times daily with a meal. 01/01/15   Jimmy FootmanHernandez-Gonzalez, Andrea, MD  metoprolol succinate (TOPROL-XL) 25 MG 24 hr tablet Take 25 mg by mouth daily. 08/29/15   [provider]  tiZANidine (ZANAFLEX) 2 MG tablet Take 2 mg by mouth every 6 (six) hours as needed. 08/29/15   [provider]    Allergies Patient has no known allergies.  Family History  Problem Relation Age of Onset  . Lung cancer Mother     Social History Social History  Substance Use Topics  . Smoking status: Never Smoker  .  Smokeless tobacco: Never Used  . Alcohol use Yes     Comment: Previously drank heavily.  Cut back in 2014 but still drinks on occassion.    Review of Systems Constitutional: No fever/chills Eyes: No visual changes. ENT: No sore throat. Cardiovascular: Denies chest pain. Respiratory: Denies shortness of breath. Gastrointestinal: No abdominal pain.  No nausea, no vomiting.  No diarrhea.   Genitourinary: Negative for dysuria. Musculoskeletal: Positive for back pain and neck pain. Skin: Negative for rash. Neurological:  Positive for numbness to hands. Positive for change in sensation of the lower extremities.   ____________________________________________   PHYSICAL EXAM:  VITAL SIGNS: ED Triage Vitals  Enc Vitals Group     BP 08/23/16 1147 (!) 147/91     Pulse Rate 08/23/16 1147 75     Resp 08/23/16 1147 18     Temp 08/23/16 1147 99 F (37.2 C)     Temp Source 08/23/16 1147 Oral     SpO2 08/23/16 1147 99 %     Weight 08/23/16 1148 265 lb (120.2 kg)     Height 08/23/16 1148 6' (1.829 m)     Head Circumference --      Peak Flow --      Pain Score 08/23/16 1147 8   Constitutional: Alert and oriented. Well appearing and in no distress. Eyes: Conjunctivae are normal.  ENT   Head: Normocephalic and atraumatic.   Nose: No congestion/rhinnorhea.   Mouth/Throat: Mucous membranes are moist.   Neck: No stridor. Hematological/Lymphatic/Immunilogical: No cervical lymphadenopathy. Cardiovascular: Normal rate, regular rhythm.  No murmurs, rubs, or gallops.  Respiratory: Normal respiratory effort without tachypnea nor retractions. Breath sounds are clear and equal bilaterally. No wheezes/rales/rhonchi. Gastrointestinal: Soft and non tender. No rebound. No guarding.  Genitourinary: Deferred Musculoskeletal: Normal range of motion in all extremities. No lower extremity edema. Neurologic:  Normal speech and language. No gross focal neurologic deficits are appreciated.  Skin:  Skin is warm, dry and intact. No rash noted. Psychiatric: Mood and affect are normal. Speech and behavior are normal. Patient exhibits appropriate insight and judgment.  ____________________________________________    LABS (pertinent positives/negatives)  None  ____________________________________________   EKG  None  ____________________________________________    RADIOLOGY  CT cervical spine IMPRESSION:  No evidence of traumatic injury to the cervical spine.    Mild degenerative changes.       Thoracic spine IMPRESSION:  Negative.    Lumbar spine IMPRESSION:  Negative.    ____________________________________________   PROCEDURES  Procedures  ____________________________________________   INITIAL IMPRESSION / ASSESSMENT AND PLAN / ED COURSE  Pertinent labs & imaging results that were available during my care of the patient were reviewed by me and considered in my medical decision making (see chart for details).  Patient presented to the emergency department today after a fall. Patient complaining of back and neck pain. Patient was not wearing a cervical collar at the time. CT scan of the neck and x-rays of the back without any acute fractures. However given patient's complaint of bilateral hand tingling and recent admission at [facility] and MRI of the cervical spine was ordered. If the MRI does not show any acute injury had do feel the patient would be safe for discharge back to jail.  ____________________________________________   FINAL CLINICAL IMPRESSION(S) / ED DIAGNOSES  Final diagnoses:  Fall, initial encounter  Back pain, unspecified back location, unspecified back pain laterality, unspecified chronicity     Note: This dictation was prepared with Dragon dictation. Any transcriptional  errors that result from this process are unintentional     Phineas Semen, MD 08/23/16 [phone]

## 2016-08-23 NOTE — ED Triage Notes (Signed)
[facility] with reports that pt had a fall this am in his cell. Pt has had multiple falls recently, last one was April causing pt to have some spinal cord injury which he is [facility] to [facility]. Pt reports he is always supposed to wear his cervical collar but did not have it on when he fell. Pt has LE deficits from last fall causing him to have to use walker to walk.

## 2016-08-23 NOTE — ED Notes (Signed)
Per mri - pt can be medicated and then brought over. [name] will take patient over.

## 2016-08-23 NOTE — ED Notes (Signed)
Pt taken to CT.

## 2016-08-23 NOTE — ED Notes (Signed)
Patient transported to MRI 

## 2016-08-23 NOTE — Discharge Instructions (Signed)
Please seek medical attention for any high fevers, chest pain, shortness of breath, change in behavior, persistent vomiting, bloody stool or any other new or concerning symptoms.  

## 2016-08-23 NOTE — ED Notes (Signed)
MD at bedside. 

## 2016-08-23 NOTE — ED Provider Notes (Signed)
-----------------------------------------   4:48 PM on 08/23/2016 -----------------------------------------  Signed out to me at 345, patient here with what he describes as worsening of his chronic upper extremity weakness and tingling bilaterally after another fall. Patient is in jail. Apparently has frequent falls there. CT scan of his neck showed no acute fracture, patient was sent for another MRI, he has had multiple recently for similar. MRI does not show any acute injury. According to Dr. Derrill KayGoodman, this was expected result, and if MRI does not show any acute difference, patient is to be discharged. Discharge paperwork is prepared already. Given that there is no acute findings we'll discharge the patient back to jail. We'll recommend outpatient follow-up with neurosurgery.   Jeanmarie PlantMcShane, James A, MD 08/23/16 [phone]

## 2016-08-23 NOTE — ED Notes (Signed)
Pt back from mri

## 2016-10-03 ENCOUNTER — Emergency Department
Admission: EM | Admit: 2016-10-03 | Discharge: 2016-10-04 | Disposition: A | Discharge status: Home / Self Care | Payer: Self-pay | Attending: Emergency Medicine | Admitting: Emergency Medicine

## 2016-10-03 DIAGNOSIS — Z794 Long term (current) use of insulin: Secondary | ICD-10-CM | POA: Insufficient documentation

## 2016-10-03 DIAGNOSIS — I2510 Atherosclerotic heart disease of native coronary artery without angina pectoris: Secondary | ICD-10-CM | POA: Insufficient documentation

## 2016-10-03 DIAGNOSIS — Z7984 Long term (current) use of oral hypoglycemic drugs: Secondary | ICD-10-CM | POA: Insufficient documentation

## 2016-10-03 DIAGNOSIS — Z7982 Long term (current) use of aspirin: Secondary | ICD-10-CM | POA: Insufficient documentation

## 2016-10-03 DIAGNOSIS — F19151 Other psychoactive substance abuse with psychoactive substance-induced psychotic disorder with hallucinations: Secondary | ICD-10-CM | POA: Insufficient documentation

## 2016-10-03 DIAGNOSIS — F1910 Other psychoactive substance abuse, uncomplicated: Secondary | ICD-10-CM

## 2016-10-03 DIAGNOSIS — F32A Depression, unspecified: Secondary | ICD-10-CM

## 2016-10-03 DIAGNOSIS — I10 Essential (primary) hypertension: Secondary | ICD-10-CM | POA: Insufficient documentation

## 2016-10-03 DIAGNOSIS — E119 Type 2 diabetes mellitus without complications: Secondary | ICD-10-CM | POA: Insufficient documentation

## 2016-10-03 DIAGNOSIS — Z79899 Other long term (current) drug therapy: Secondary | ICD-10-CM | POA: Insufficient documentation

## 2016-10-03 DIAGNOSIS — F329 Major depressive disorder, single episode, unspecified: Secondary | ICD-10-CM | POA: Insufficient documentation

## 2016-10-03 LAB — CBC
HEMATOCRIT: 41.4 % (ref 40.0–52.0)
Hemoglobin: 14.4 g/dL (ref 13.0–18.0)
MCH: 29.6 pg (ref 26.0–34.0)
MCHC: 34.8 g/dL (ref 32.0–36.0)
MCV: 84.9 fL (ref 80.0–100.0)
Platelets: 257 10*3/uL (ref 150–440)
RBC: 4.88 MIL/uL (ref 4.40–5.90)
RDW: 14.2 % (ref 11.5–14.5)
WBC: 7.7 10*3/uL (ref 3.8–10.6)

## 2016-10-03 LAB — COMPREHENSIVE METABOLIC PANEL
ALT: 24 U/L (ref 17–63)
AST: 21 U/L (ref 15–41)
Albumin: 4.5 g/dL (ref 3.5–5.0)
Alkaline Phosphatase: 43 U/L (ref 38–126)
Anion gap: 8 (ref 5–15)
BILIRUBIN TOTAL: 0.8 mg/dL (ref 0.3–1.2)
BUN: 10 mg/dL (ref 6–20)
CHLORIDE: 105 mmol/L (ref 101–111)
CO2: 25 mmol/L (ref 22–32)
Calcium: 9 mg/dL (ref 8.9–10.3)
Creatinine, Ser: 1.05 mg/dL (ref 0.61–1.24)
Glucose, Bld: 177 mg/dL — ABNORMAL HIGH (ref 65–99)
POTASSIUM: 3.8 mmol/L (ref 3.5–5.1)
Sodium: 138 mmol/L (ref 135–145)
TOTAL PROTEIN: 7 g/dL (ref 6.5–8.1)

## 2016-10-03 LAB — ACETAMINOPHEN LEVEL

## 2016-10-03 LAB — ETHANOL

## 2016-10-03 LAB — SALICYLATE LEVEL

## 2016-10-03 MED ORDER — FENTANYL CITRATE (PF) 100 MCG/2ML IJ SOLN: 50.0000 ug | Freq: Once | INTRAMUSCULAR | Status: DC

## 2016-10-03 NOTE — ED Notes (Signed)
Patient belongings:  Pair of shoes Red shorts Grey shirt Black socks  Valuables given to wife.

## 2016-10-03 NOTE — ED Triage Notes (Signed)
Pt reports chest pain while getting vitals. EKG performed.

## 2016-10-03 NOTE — ED Notes (Signed)
PT VOL. PENDING SOC CONSULT  

## 2016-10-03 NOTE — ED Provider Notes (Signed)
[facility] Emergency Department Provider Note  Time seen: 10:34 PM  I have reviewed the triage vital signs and the nursing notes.   HISTORY  Chief Complaint Hallucinations and Addiction Problem    HPI [name] is a 51 y.o. male with a past medical history of substance abuse, hypertension, hyperlipidemia presents to the emergency departmentwith his wife complaining of hallucinations. States he keeps seeing things that he can explain. He also states he feels that his heart is racing at times. After the patient's wife left the room the patient admitted to using methamphetamine for the past 3 days which she states is the first time he is ever used this drug. Denies any SI or HI. Denies any intention to hurt himself or anybody else. Patient states he wants help to get off of the drugs. States depression but again denies any SI or HI.  Past Medical History:  Diagnosis Date  . Borderline diabetes   . CAD (coronary artery disease)    a. 2007 MI in [place], [place] ->Cath reportedly nl.  . Cocaine abuse   . Diabetes mellitus without complication (HCC)   . History of ETOH abuse    a. Cut back beginning in 2014.  Marland Kitchen. HTN (hypertension)   . Hyperlipidemia   . Obesity   . PAF (paroxysmal atrial fibrillation) (HCC)    a. Dx in 2007->s/p DCCV x 2 (last "a few yrs ago");  b. Never anticoagulated (CHA2DS2VASc = 1).  . Spinal cord injury, thoracic region [facility](HCC)     Patient Active Problem List   Diagnosis Date Noted  . Suicide attempt (HCC) 02/13/2016  . Substance induced mood disorder (HCC) 10/09/2015  . Gout 12/31/2014  . Atrial fibrillation (HCC) 12/31/2014  . Alcohol use disorder, severe, dependence (HCC) 12/28/2014  . Alcohol withdrawal (HCC) 12/28/2014  . Dyslipidemia 12/28/2014  . Stimulant use disorder- cocaine 12/28/2014  . Severe recurrent major depression without psychotic features (HCC) 12/27/2014  . Diabetes (HCC) 12/27/2014  . Hypertension 12/27/2014     No past surgical history on file.  Prior to Admission medications   Medication Sig Start Date End Date Taking? Authorizing Provider  aspirin 81 MG chewable tablet Chew 1 tablet (81 mg total) by mouth daily. 01/01/15  Yes Jimmy FootmanHernandez-Gonzalez, Andrea, MD  cyclobenzaprine (FLEXERIL) 10 MG tablet Take 10 mg by mouth 3 (three) times daily as needed for muscle spasms.   Yes [provider]  diltiazem (CARDIZEM CD) 240 MG 24 hr capsule Take 240 mg by mouth daily.   Yes [provider]  famotidine (PEPCID) 20 MG tablet Take 20 mg by mouth daily.  08/29/15  Yes [provider]  gabapentin (NEURONTIN) 300 MG capsule Take 600 mg by mouth 3 (three) times daily.  08/29/15  Yes [provider]  insulin glargine (LANTUS) 100 UNIT/ML injection Inject 35 Units into the skin at bedtime.  08/29/15  Yes [provider]  insulin lispro (HUMALOG) 100 UNIT/ML injection Inject 16 Units into the skin 3 (three) times daily with meals.   Yes [provider]  Melatonin 3 MG TABS Take 3 mg by mouth at bedtime as needed. 08/29/15  Yes [provider]  metFORMIN (GLUCOPHAGE) 1000 MG tablet Take 1 tablet (1,000 mg total) by mouth 2 (two) times daily with a meal. 01/01/15  Yes Jimmy FootmanHernandez-Gonzalez, Andrea, MD  oxycodone (OXY-IR) 5 MG capsule Take 10 mg by mouth every 6 (six) hours as needed.   Yes [provider]    No  Known Allergies  Family History  Problem Relation Age of Onset  . Lung cancer Mother     Social History Social History  Substance Use Topics  . Smoking status: Never Smoker  . Smokeless tobacco: Never Used  . Alcohol use Yes     Comment: Previously drank heavily.  Cut back in 2014 but still drinks on occassion.    Review of Systems Constitutional: Negative for fever. Cardiovascular: Negative for chest pain.Positive for palpitations. Respiratory: Negative for shortness of breath. Gastrointestinal: Negative for abdominal  pain Musculoskeletal: Negative for back pain. Skin: Negative for rash. Neurological: Negative for headache All other ROS negative  ____________________________________________   PHYSICAL EXAM:  VITAL SIGNS: ED Triage Vitals  Enc Vitals Group     BP 10/03/16 2124 (!) 143/96     Pulse Rate 10/03/16 2124 85     Resp --      Temp 10/03/16 2124 98.7 F (37.1 C)     Temp Source 10/03/16 2124 Oral     SpO2 10/03/16 2124 97 %     Weight 10/03/16 2131 260 lb (117.9 kg)     Height 10/03/16 2131 6' (1.829 m)     Head Circumference --      Peak Flow --      Pain Score --      Pain Loc --      Pain Edu? --      Excl. in GC? --     Constitutional: Somnolent, but awakens easily to voice answers questions appropriately, is calm and cooperative. Eyes: Normal exam ENT   Head: Normocephalic and atraumatic.   Mouth/Throat: Mucous membranes are moist. Cardiovascular: Normal rate, regular rhythm. No murmur Respiratory: Normal respiratory effort without tachypnea nor retractions. Breath sounds are clear  Gastrointestinal: Soft and nontender. No distention.  Musculoskeletal: Nontender with normal range of motion in all extremities.  Neurologic:  Normal speech and language. No gross focal neurologic deficits Skin:  Skin is warm, dry and intact.  Psychiatric: Patient admits to drug use, admits to depression. Denies SI or HI.  ____________________________________________    EKG  EKG reviewed and interpreted by myself shows normal sinus rhythm at 73 bpm, slightly widened QRS, left axis deviation, normal intervals and nonspecific ST changes.  INITIAL IMPRESSION / ASSESSMENT AND PLAN / ED COURSE  Pertinent labs & imaging results that were available during my care of the patient were reviewed by me and considered in my medical decision making (see chart for details).  Patient presents the emergency department initially complaining of seeing things can explain, later admits to using  methamphetamines for the past 3 days and is asking for help to detox. Patient admits to depression but denies SI or HI. We will check labs and closely monitor. We will have psychiatry and TTS see the patient. He does not currently meet any involuntary commitment criteria.  ____________________________________________   FINAL CLINICAL IMPRESSION(S) / ED DIAGNOSES  Depression Substance abuse    Minna Antis, MD 10/03/16 2237

## 2016-10-03 NOTE — ED Triage Notes (Signed)
Pt presents via POV with wife c/o visual and auditory hallucinations. Denies SI/HI. Reports hx PTSD. Reports seeing "2 guys". States "i cant explain it" when asked when he sees.

## 2016-10-03 NOTE — ED Notes (Addendum)
[facility]ong[facility], searched in triage.

## 2016-10-03 NOTE — ED Notes (Signed)
Pt's wife left. Pt now states he did crystal meth tonight. States in the past it has given him hallucinations.

## 2016-10-04 NOTE — ED Notes (Signed)
Patient is resting comfortably. 

## 2016-10-04 NOTE — ED Provider Notes (Signed)
Patient was seen by psychiatrist. I do feel he is safe for outpatient follow-up. They do not have any medication recommendations at this time.   Phineas SemenGoodman, Isadore Bokhari, MD 10/04/16 1250

## 2016-10-04 NOTE — Discharge Instructions (Signed)
Please seek medical attention and help for any thoughts about wanting to harm yourself, harm others, any concerning change in behavior, severe depression, inappropriate drug use or any other new or concerning symptoms. ° °

## 2016-10-04 NOTE — ED Provider Notes (Signed)
-----------------------------------------   5:33 AM on 10/04/2016 -----------------------------------------   Blood pressure 115/66, pulse 74, temperature 98.7 F (37.1 C), temperature source Oral, height 1.829 m (6'), weight 117.9 kg (260 lb), SpO2 99 %.  The patient had no acute events since last update.  Calm and cooperative at this time.  Voluntary, awaiting SOC evaluation   Loleta RoseForbach, Jazira Maloney, MD 10/04/16 [phone]

## 2016-10-04 NOTE — BH Assessment (Signed)
2nd attempt: Unable to complete TTS consult at this time; pt asleep.

## 2016-10-04 NOTE — BH Assessment (Signed)
Unable to complete TTS consult at this time; pt asleep. 

## 2017-03-06 ENCOUNTER — Emergency Department: Payer: Self-pay

## 2017-03-06 ENCOUNTER — Other Ambulatory Visit: Payer: Self-pay

## 2017-03-06 ENCOUNTER — Emergency Department
Admission: EM | Admit: 2017-03-06 | Discharge: 2017-03-06 | Disposition: A | Discharge status: Home / Self Care | Payer: Self-pay | Attending: Emergency Medicine | Admitting: Emergency Medicine

## 2017-03-06 ENCOUNTER — Encounter: Payer: Self-pay | Admitting: *Deleted

## 2017-03-06 DIAGNOSIS — I10 Essential (primary) hypertension: Secondary | ICD-10-CM | POA: Insufficient documentation

## 2017-03-06 DIAGNOSIS — Z794 Long term (current) use of insulin: Secondary | ICD-10-CM | POA: Insufficient documentation

## 2017-03-06 DIAGNOSIS — R0789 Other chest pain: Secondary | ICD-10-CM | POA: Insufficient documentation

## 2017-03-06 DIAGNOSIS — Z79899 Other long term (current) drug therapy: Secondary | ICD-10-CM | POA: Insufficient documentation

## 2017-03-06 DIAGNOSIS — R079 Chest pain, unspecified: Secondary | ICD-10-CM

## 2017-03-06 DIAGNOSIS — Z7982 Long term (current) use of aspirin: Secondary | ICD-10-CM | POA: Insufficient documentation

## 2017-03-06 DIAGNOSIS — I2510 Atherosclerotic heart disease of native coronary artery without angina pectoris: Secondary | ICD-10-CM | POA: Insufficient documentation

## 2017-03-06 DIAGNOSIS — E119 Type 2 diabetes mellitus without complications: Secondary | ICD-10-CM | POA: Insufficient documentation

## 2017-03-06 LAB — CBC WITH DIFFERENTIAL/PLATELET
Basophils Absolute: 0.1 10*3/uL (ref 0–0.1)
Basophils Relative: 1 %
EOS ABS: 0.2 10*3/uL (ref 0–0.7)
Eosinophils Relative: 2 %
HEMATOCRIT: 46.5 % (ref 40.0–52.0)
HEMOGLOBIN: 15.9 g/dL (ref 13.0–18.0)
LYMPHS ABS: 2.4 10*3/uL (ref 1.0–3.6)
LYMPHS PCT: 26 %
MCH: 29.7 pg (ref 26.0–34.0)
MCHC: 34.2 g/dL (ref 32.0–36.0)
MCV: 87 fL (ref 80.0–100.0)
MONOS PCT: 9 %
Monocytes Absolute: 0.8 10*3/uL (ref 0.2–1.0)
NEUTROS ABS: 5.7 10*3/uL (ref 1.4–6.5)
NEUTROS PCT: 62 %
Platelets: 262 10*3/uL (ref 150–440)
RBC: 5.34 MIL/uL (ref 4.40–5.90)
RDW: 13.7 % (ref 11.5–14.5)
WBC: 9.2 10*3/uL (ref 3.8–10.6)

## 2017-03-06 LAB — COMPREHENSIVE METABOLIC PANEL
ALBUMIN: 4.5 g/dL (ref 3.5–5.0)
ALT: 33 U/L (ref 17–63)
ANION GAP: 15 (ref 5–15)
AST: 46 U/L — AB (ref 15–41)
Alkaline Phosphatase: 54 U/L (ref 38–126)
BUN: 13 mg/dL (ref 6–20)
CHLORIDE: 97 mmol/L — AB (ref 101–111)
CO2: 22 mmol/L (ref 22–32)
Calcium: 9.5 mg/dL (ref 8.9–10.3)
Creatinine, Ser: 1.2 mg/dL (ref 0.61–1.24)
GFR calc Af Amer: >60 mL/min (ref >60)
GFR calc non Af Amer: >60 mL/min (ref >60)
GLUCOSE: 205 mg/dL — AB (ref 65–99)
POTASSIUM: 3.7 mmol/L (ref 3.5–5.1)
SODIUM: 134 mmol/L — AB (ref 135–145)
Total Bilirubin: 1.3 mg/dL — ABNORMAL HIGH (ref 0.3–1.2)
Total Protein: 7.7 g/dL (ref 6.5–8.1)

## 2017-03-06 LAB — ETHANOL

## 2017-03-06 LAB — BRAIN NATRIURETIC PEPTIDE: B Natriuretic Peptide: 7 pg/mL (ref 0.0–100.0)

## 2017-03-06 LAB — TROPONIN I
Troponin I: <0.03 ng/mL (ref <0.03)
Troponin I: <0.03 ng/mL (ref <0.03)

## 2017-03-06 MED ORDER — FENTANYL CITRATE (PF) 100 MCG/2ML IJ SOLN
100.0000 ug | Freq: Once | INTRAMUSCULAR | Status: AC
  Administered 2017-03-06: 100 ug via INTRAVENOUS
  Filled 2017-03-06: qty 2

## 2017-03-06 MED ORDER — KETOROLAC TROMETHAMINE 30 MG/ML IJ SOLN
15.0000 mg | Freq: Once | INTRAMUSCULAR | Status: AC
  Administered 2017-03-06: 15 mg via INTRAVENOUS
  Filled 2017-03-06: qty 1

## 2017-03-06 MED ORDER — LORAZEPAM 2 MG/ML IJ SOLN
2.0000 mg | Freq: Once | INTRAMUSCULAR | Status: AC
  Administered 2017-03-06: 2 mg via INTRAVENOUS
  Filled 2017-03-06: qty 1

## 2017-03-06 NOTE — ED Triage Notes (Signed)
Pt to ED reporting sudden onset of substernal chest pain. Pt to ED with increased WOB noted. EMS reports having given one of nitro that pt reports did not decrease the chest pain.  Hx of HTN and MI in 2008.

## 2017-03-06 NOTE — ED Provider Notes (Signed)
[facility] Emergency Department Provider Note  ____________________________________________   First MD Initiated Contact with Patient 03/06/17 1221     (approximate)  I have reviewed the triage vital signs and the nursing notes.   HISTORY  Chief Complaint Chest Pain   HPI [name] is a 51 y.o. male who comes to the emergency department by EMS with chest pain.  He says that he was sitting on his porch when he felt his heart began to race and then suddenly developed severe sharp and pressure-like chest pain sitting on the center of his chest.  It has been completely constant ever since it began and nothing is made it better or worse.  He took 3 baby aspirin at home.  EMS gave him a spray of nitroglycerin without improvement.  He did not have nausea vomiting.  He had no diaphoresis.  The pain was nonradiating.  He does report a remote history of myocardial infarction although he has not followed up with cardiology.  He denies having a stent.  He denies history of DVT or pulmonary embolism.  His pain was not ripping or tearing and did not go straight to his back.  He does report a history of cocaine abuse although last use 6-8 months ago.  He reports a family history of diabetes mellitus.  Past Medical History:  Diagnosis Date  . Borderline diabetes   . CAD (coronary artery disease)    a. [place]Cath reportedly nl.  . Cocaine abuse (HCC)   . Diabetes mellitus without complication (HCC)   . History of ETOH abuse    a. Cut back beginning in 2014.  Marland Kitchen. HTN (hypertension)   . Hyperlipidemia   . Obesity   . PAF (paroxysmal atrial fibrillation) (HCC)    a. Dx in 2007->s/p DCCV x 2 (last "a few yrs ago");  b. Never anticoagulated (CHA2DS2VASc = 1).  . Spinal cord injury, thoracic region [facility](HCC)     Patient Active Problem List   Diagnosis Date Noted  . Suicide attempt (HCC) 02/13/2016  . Substance induced mood disorder (HCC) 10/09/2015  .  Gout 12/31/2014  . Atrial fibrillation (HCC) 12/31/2014  . Alcohol use disorder, severe, dependence (HCC) 12/28/2014  . Alcohol withdrawal (HCC) 12/28/2014  . Dyslipidemia 12/28/2014  . Stimulant use disorder- cocaine 12/28/2014  . Severe recurrent major depression without psychotic features (HCC) 12/27/2014  . Diabetes (HCC) 12/27/2014  . Hypertension 12/27/2014    History reviewed. No pertinent surgical history.  Prior to Admission medications   Medication Sig Start Date End Date Taking? Authorizing Provider  aspirin 81 MG chewable tablet Chew 1 tablet (81 mg total) by mouth daily. 01/01/15   Jimmy FootmanHernandez-Gonzalez, Andrea, MD  cyclobenzaprine (FLEXERIL) 10 MG tablet Take 10 mg by mouth 3 (three) times daily as needed for muscle spasms.    [provider]  diltiazem (CARDIZEM CD) 240 MG 24 hr capsule Take 240 mg by mouth daily.    [provider]  famotidine (PEPCID) 20 MG tablet Take 20 mg by mouth daily.  08/29/15   [provider]  gabapentin (NEURONTIN) 300 MG capsule Take 600 mg by mouth 3 (three) times daily.  08/29/15   [provider]  insulin glargine (LANTUS) 100 UNIT/ML injection Inject 35 Units into the skin at bedtime.  08/29/15   [provider]  insulin lispro (HUMALOG) 100 UNIT/ML injection Inject 16 Units into the skin 3 (three) times daily with meals.  [provider]  Melatonin 3 MG TABS Take 3 mg by mouth at bedtime as needed. 08/29/15   [provider]  metFORMIN (GLUCOPHAGE) 1000 MG tablet Take 1 tablet (1,000 mg total) by mouth 2 (two) times daily with a meal. 01/01/15   Jimmy Footman, MD  oxycodone (OXY-IR) 5 MG capsule Take 10 mg by mouth every 6 (six) hours as needed.    [provider]    Allergies Patient has no known allergies.  Family History  Problem Relation Age of Onset  . Lung cancer Mother     Social History Social History   Tobacco Use  . Smoking status: Never Smoker    . Smokeless tobacco: Never Used  Substance Use Topics  . Alcohol use: Yes    Comment: Previously drank heavily.  Cut back in 2014 but still drinks on occassion.  . Drug use: No    Comment: Habitual crack cocaine usage.    Review of Systems Constitutional: No fever/chills Eyes: No visual changes. ENT: No sore throat. Cardiovascular: Positive for chest pain. Respiratory: Denies shortness of breath. Gastrointestinal: No abdominal pain.  No nausea, no vomiting.  No diarrhea.  No constipation. Genitourinary: Negative for dysuria. Musculoskeletal: Negative for back pain. Skin: Negative for rash. Neurological: Negative for headaches, focal weakness or numbness.   ____________________________________________   PHYSICAL EXAM:  VITAL SIGNS: ED Triage Vitals  Enc Vitals Group     BP --      Pulse Rate 03/06/17 1218 86     Resp 03/06/17 1218 (!) 23     Temp --      Temp src --      SpO2 --      Weight 03/06/17 1219 265 lb (120.2 kg)     Height 03/06/17 1219 6' (1.829 m)     Head Circumference --      Peak Flow --      Pain Score 03/06/17 1218 10     Pain Loc --      Pain Edu? --      Excl. in GC? --     Constitutional: Alert and oriented x4 hyperventilating and appears uncomfortable Eyes: PERRL EOMI. Head: Atraumatic. Nose: No congestion/rhinnorhea. Mouth/Throat: No trismus Neck: No stridor.   Cardiovascular: Normal rate, regular rhythm. Grossly normal heart sounds.  Good peripheral circulation. Respiratory: Increased respiratory effort taking rapid shallow breaths moving good air Gastrointestinal: Soft nontender Musculoskeletal: No lower extremity edema   Neurologic:  Normal speech and language. No gross focal neurologic deficits are appreciated. Skin:  Skin is warm, dry and intact. No rash noted. Psychiatric: Anxious appearing    ____________________________________________   DIFFERENTIAL includes but not limited to  Acute coronary syndrome, aortic dissection,  pulmonary embolism, panic attack, Prinzmetal's angina ____________________________________________   LABS (all labs ordered are listed, but only abnormal results are displayed)  Labs Reviewed  COMPREHENSIVE METABOLIC PANEL - Abnormal; Notable for the following components:      Result Value   Sodium 134 (*)    Chloride 97 (*)    Glucose, Bld 205 (*)    AST 46 (*)    Total Bilirubin 1.3 (*)    All other components within normal limits  TROPONIN I  BRAIN NATRIURETIC PEPTIDE  CBC WITH DIFFERENTIAL/PLATELET  ETHANOL  TROPONIN I    Blood work reviewed by me shows elevated glucose with normal anion gap.  No signs of acute ischemia __________________________________________  EKG  ED ECG REPORT I, Merrily Brittle, the attending physician, personally  viewed and interpreted this ECG.  Date: 03/06/2017 EKG Time: 1218 Rate: 88 Rhythm: normal sinus rhythm QRS Axis: normal Intervals: normal ST/T Wave abnormalities: normal Narrative Interpretation: no evidence of acute ischemia poor R wave progression with inferior Q waves but no acute ischemia  ____________________________________________  RADIOLOGY  6 reviewed by me with no acute disease ____________________________________________   PROCEDURES  Procedure(s) performed: no  Procedures  Critical Care performed: no  Observation: yes  ----------------------------------------- 15:20 PM on 03/06/2017 -----------------------------------------   OBSERVATION CARE: This patient is being placed under observation care for the following reasons: Chest pain with repeat testing to rule out ischemia    ____________________________________________   INITIAL IMPRESSION / ASSESSMENT AND PLAN / ED COURSE  Pertinent labs & imaging results that were available during my care of the patient were reviewed by me and considered in my medical decision making (see chart for details).  On arrival the patient is quite anxious appearing  with atypical chest pain, although he does have significant cardiac history.  At this point I will give him a dose of pain medication and he will require at least 2 troponins.     ----------------------------------------- 1:21 PM on 03/06/2017 -----------------------------------------  After fentanyl the patient's pain is somewhat improved, although not resolved.  Again his history is not classic for cardiac etiology.  He is PERC negative aside from age. ____________________________________________   ----------------------------------------- 3:30 PM on 03/06/2017 -----------------------------------------  The patient is currently in no pain pending second troponin.  If his second troponin is negative he will be stable for discharge.  If it is positive he will require inpatient admission.  FINAL CLINICAL IMPRESSION(S) / ED DIAGNOSES  Final diagnoses:  Nonspecific chest pain      NEW MEDICATIONS STARTED DURING THIS VISIT:  This SmartLink is deprecated. Use AVSMEDLIST instead to display the medication list for a patient.   Note:  This document was prepared using Dragon voice recognition software and may include unintentional dictation errors.     Merrily Brittleifenbark, Keyondra Lagrand, MD 03/06/17 [phone]

## 2017-03-06 NOTE — ED Notes (Signed)
RN left room to send blood and pt started yelling for help. RN back in room and pt continues yelling for help and stating, "I do not feel well." RN talked to pt and convinced pt to control breathing again. Pt attempting to slow breathing. Vitals remain WNL.

## 2017-03-06 NOTE — ED Notes (Addendum)
Pt placed on 2L [place] after having fallen asleep. PT reports he is drowsy after ativan administration. Pt continues to wake up to his name and can carry on conversation. [place] placed on pt while sleeping .

## 2017-03-06 NOTE — ED Notes (Signed)

## 2017-03-06 NOTE — ED Provider Notes (Signed)
-----------------------------------------   4:20 PM on 03/06/2017 -----------------------------------------   END OF OBSERVATION STATUS: After an appropriate period of observation, this patient is being discharged due to the following reason(s): Repeat troponin was negative.  He is stable for outpatient follow-up.   [name], Solomiya Pascale E, MD 03/06/17 [phone]

## 2017-03-06 NOTE — Discharge Instructions (Signed)
Fortunately today your blood work is reassuring.  Please make an appointment to establish care with cardiology this coming week for recheck and return to the emergency department sooner for any concerns whatsoever.  It was a pleasure to take care of you today, and thank you for coming to our emergency department.  If you have any questions or concerns before leaving please ask the nurse to grab me and I'm more than happy to go through your aftercare instructions again.  If you were prescribed any opioid pain medication today such as Norco, Vicodin, Percocet, morphine, hydrocodone, or oxycodone please make sure you do not drive when you are taking this medication as it can alter your ability to drive safely.  If you have any concerns once you are home that you are not improving or are in fact getting worse before you can make it to your follow-up appointment, please do not hesitate to call [phone] and come back for further evaluation.  Merrily BrittleNeil Shirlie Enck, MD  Results for orders placed or performed during the hospital encounter of 03/06/17  Comprehensive metabolic panel  Result Value Ref Range   Sodium 134 (L) 135 - 145 mmol/L   Potassium 3.7 3.5 - 5.1 mmol/L   Chloride 97 (L) 101 - 111 mmol/L   CO2 22 22 - 32 mmol/L   Glucose, Bld 205 (H) 65 - 99 mg/dL   BUN 13 6 - 20 mg/dL   Creatinine, Ser 4.541.20 0.61 - 1.24 mg/dL   Calcium 9.5 8.9 - 09.810.3 mg/dL   Total Protein 7.7 6.5 - 8.1 g/dL   Albumin 4.5 3.5 - 5.0 g/dL   AST 46 (H) 15 - 41 U/L   ALT 33 17 - 63 U/L   Alkaline Phosphatase 54 38 - 126 U/L   Total Bilirubin 1.3 (H) 0.3 - 1.2 mg/dL   GFR calc non Af Amer >60 >60 mL/min   GFR calc Af Amer >60 >60 mL/min   Anion gap 15 5 - 15  Troponin I  Result Value Ref Range   Troponin I <0.03 <0.03 ng/mL  Brain natriuretic peptide  Result Value Ref Range   B Natriuretic Peptide 7.0 0.0 - 100.0 pg/mL  CBC with Differential  Result Value Ref Range   WBC 9.2 3.8 - 10.6 K/uL   RBC 5.34 4.40 - 5.90 MIL/uL     Hemoglobin 15.9 13.0 - 18.0 g/dL   HCT 11.946.5 14.740.0 - 82.952.0 %   MCV 87.0 80.0 - 100.0 fL   MCH 29.7 26.0 - 34.0 pg   MCHC 34.2 32.0 - 36.0 g/dL   RDW 56.213.7 13.011.5 - 86.514.5 %   Platelets 262 150 - 440 K/uL   Neutrophils Relative % 62 %   Neutro Abs 5.7 1.4 - 6.5 K/uL   Lymphocytes Relative 26 %   Lymphs Abs 2.4 1.0 - 3.6 K/uL   Monocytes Relative 9 %   Monocytes Absolute 0.8 0.2 - 1.0 K/uL   Eosinophils Relative 2 %   Eosinophils Absolute 0.2 0 - 0.7 K/uL   Basophils Relative 1 %   Basophils Absolute 0.1 0 - 0.1 K/uL  Ethanol  Result Value Ref Range   Alcohol, Ethyl (B) <10 <10 mg/dL   Dg Chest Port 1 View  Result Date: 03/06/2017 CLINICAL DATA:  Central chest pain and shortness of breath. EXAM: PORTABLE CHEST 1 VIEW COMPARISON:  03/28/2015 FINDINGS: Heart size is normal. Mediastinal shadows are normal. The lungs are clear. The vascularity is normal. No effusions. No abnormal bone  finding. IMPRESSION: No active disease. Electronically Signed   By: Paulina FusiMark  Shogry M.D.   On: 03/06/2017 13:01
